# Patient Record
Sex: Female | Born: 1945 | Race: White | Hispanic: No | Marital: Married | Smoking: Never smoker
Health system: Southern US, Community
[De-identification: ages and names within clinical notes are randomized; demographics above are authoritative.]

## PROBLEM LIST (undated history)

## (undated) DIAGNOSIS — K222 Esophageal obstruction: Secondary | ICD-10-CM

## (undated) DIAGNOSIS — N200 Calculus of kidney: Secondary | ICD-10-CM

## (undated) DIAGNOSIS — K449 Diaphragmatic hernia without obstruction or gangrene: Secondary | ICD-10-CM

## (undated) DIAGNOSIS — D126 Benign neoplasm of colon, unspecified: Secondary | ICD-10-CM

## (undated) DIAGNOSIS — K759 Inflammatory liver disease, unspecified: Secondary | ICD-10-CM

## (undated) DIAGNOSIS — M109 Gout, unspecified: Secondary | ICD-10-CM

## (undated) DIAGNOSIS — B159 Hepatitis A without hepatic coma: Secondary | ICD-10-CM

## (undated) DIAGNOSIS — K648 Other hemorrhoids: Secondary | ICD-10-CM

## (undated) DIAGNOSIS — M1990 Unspecified osteoarthritis, unspecified site: Secondary | ICD-10-CM

## (undated) DIAGNOSIS — K2950 Unspecified chronic gastritis without bleeding: Secondary | ICD-10-CM

## (undated) DIAGNOSIS — K219 Gastro-esophageal reflux disease without esophagitis: Secondary | ICD-10-CM

## (undated) DIAGNOSIS — K5790 Diverticulosis of intestine, part unspecified, without perforation or abscess without bleeding: Secondary | ICD-10-CM

## (undated) DIAGNOSIS — E7800 Pure hypercholesterolemia, unspecified: Secondary | ICD-10-CM

## (undated) DIAGNOSIS — M65841 Other synovitis and tenosynovitis, right hand: Secondary | ICD-10-CM

## (undated) HISTORY — DX: Esophageal obstruction: K22.2

## (undated) HISTORY — DX: Benign neoplasm of colon, unspecified: D12.6

## (undated) HISTORY — DX: Calculus of kidney: N20.0

## (undated) HISTORY — DX: Diverticulosis of intestine, part unspecified, without perforation or abscess without bleeding: K57.90

## (undated) HISTORY — DX: Pure hypercholesterolemia, unspecified: E78.00

## (undated) HISTORY — DX: Diaphragmatic hernia without obstruction or gangrene: K44.9

## (undated) HISTORY — DX: Other hemorrhoids: K64.8

## (undated) HISTORY — PX: KNEE ARTHROSCOPY: SHX127

## (undated) HISTORY — PX: CARPAL TUNNEL RELEASE: SHX101

## (undated) HISTORY — DX: Unspecified chronic gastritis without bleeding: K29.50

## (undated) HISTORY — DX: Gout, unspecified: M10.9

## (undated) HISTORY — DX: Hepatitis a without hepatic coma: B15.9

## (undated) HISTORY — DX: Unspecified osteoarthritis, unspecified site: M19.90

## (undated) HISTORY — PX: POLYPECTOMY: SHX149

## (undated) HISTORY — DX: Gastro-esophageal reflux disease without esophagitis: K21.9

---

## 2006-08-02 HISTORY — PX: HEMORROIDECTOMY: SUR656

## 2007-08-03 HISTORY — PX: ABDOMINAL HYSTERECTOMY: SHX81

## 2009-12-16 ENCOUNTER — Encounter (HOSPITAL_COMMUNITY): Payer: Self-pay | Admitting: Obstetrics and Gynecology

## 2009-12-16 ENCOUNTER — Inpatient Hospital Stay (HOSPITAL_COMMUNITY): Admission: RE | Admit: 2009-12-16 | Discharge: 2009-12-18 | Payer: Self-pay | Admitting: Obstetrics and Gynecology

## 2010-10-19 LAB — CBC
HCT: 31 % — ABNORMAL LOW (ref 36.0–46.0)
Hemoglobin: 13.9 g/dL (ref 12.0–15.0)
MCHC: 33.5 g/dL (ref 30.0–36.0)
MCHC: 34.9 g/dL (ref 30.0–36.0)
MCV: 93.2 fL (ref 78.0–100.0)
MCV: 94.3 fL (ref 78.0–100.0)
Platelets: 177 10*3/uL (ref 150–400)
RDW: 14 % (ref 11.5–15.5)
RDW: 14.3 % (ref 11.5–15.5)

## 2010-10-19 LAB — COMPREHENSIVE METABOLIC PANEL
Alkaline Phosphatase: 66 U/L (ref 39–117)
Calcium: 9.5 mg/dL (ref 8.4–10.5)
Chloride: 105 mEq/L (ref 96–112)
Creatinine, Ser: 0.67 mg/dL (ref 0.4–1.2)
GFR calc Af Amer: >60 mL/min (ref >60)
Potassium: 3.7 mEq/L (ref 3.5–5.1)
Sodium: 139 mEq/L (ref 135–145)
Total Bilirubin: 0.8 mg/dL (ref 0.3–1.2)

## 2010-10-19 LAB — ABO/RH: ABO/RH(D): A NEG

## 2010-10-19 LAB — TYPE AND SCREEN: Antibody Screen: NEGATIVE

## 2010-11-06 IMAGING — CR DG ABD PORTABLE 1V
2 series · 2 of 2 positions shown · non-contrast
Comparison: None.

CLINICAL DATA: Evaluate for foreign body; BB sized metallic needle;
pelvic organ prolapse

ABDOMEN - 1 VIEW

[view not recorded (1 of 2)]
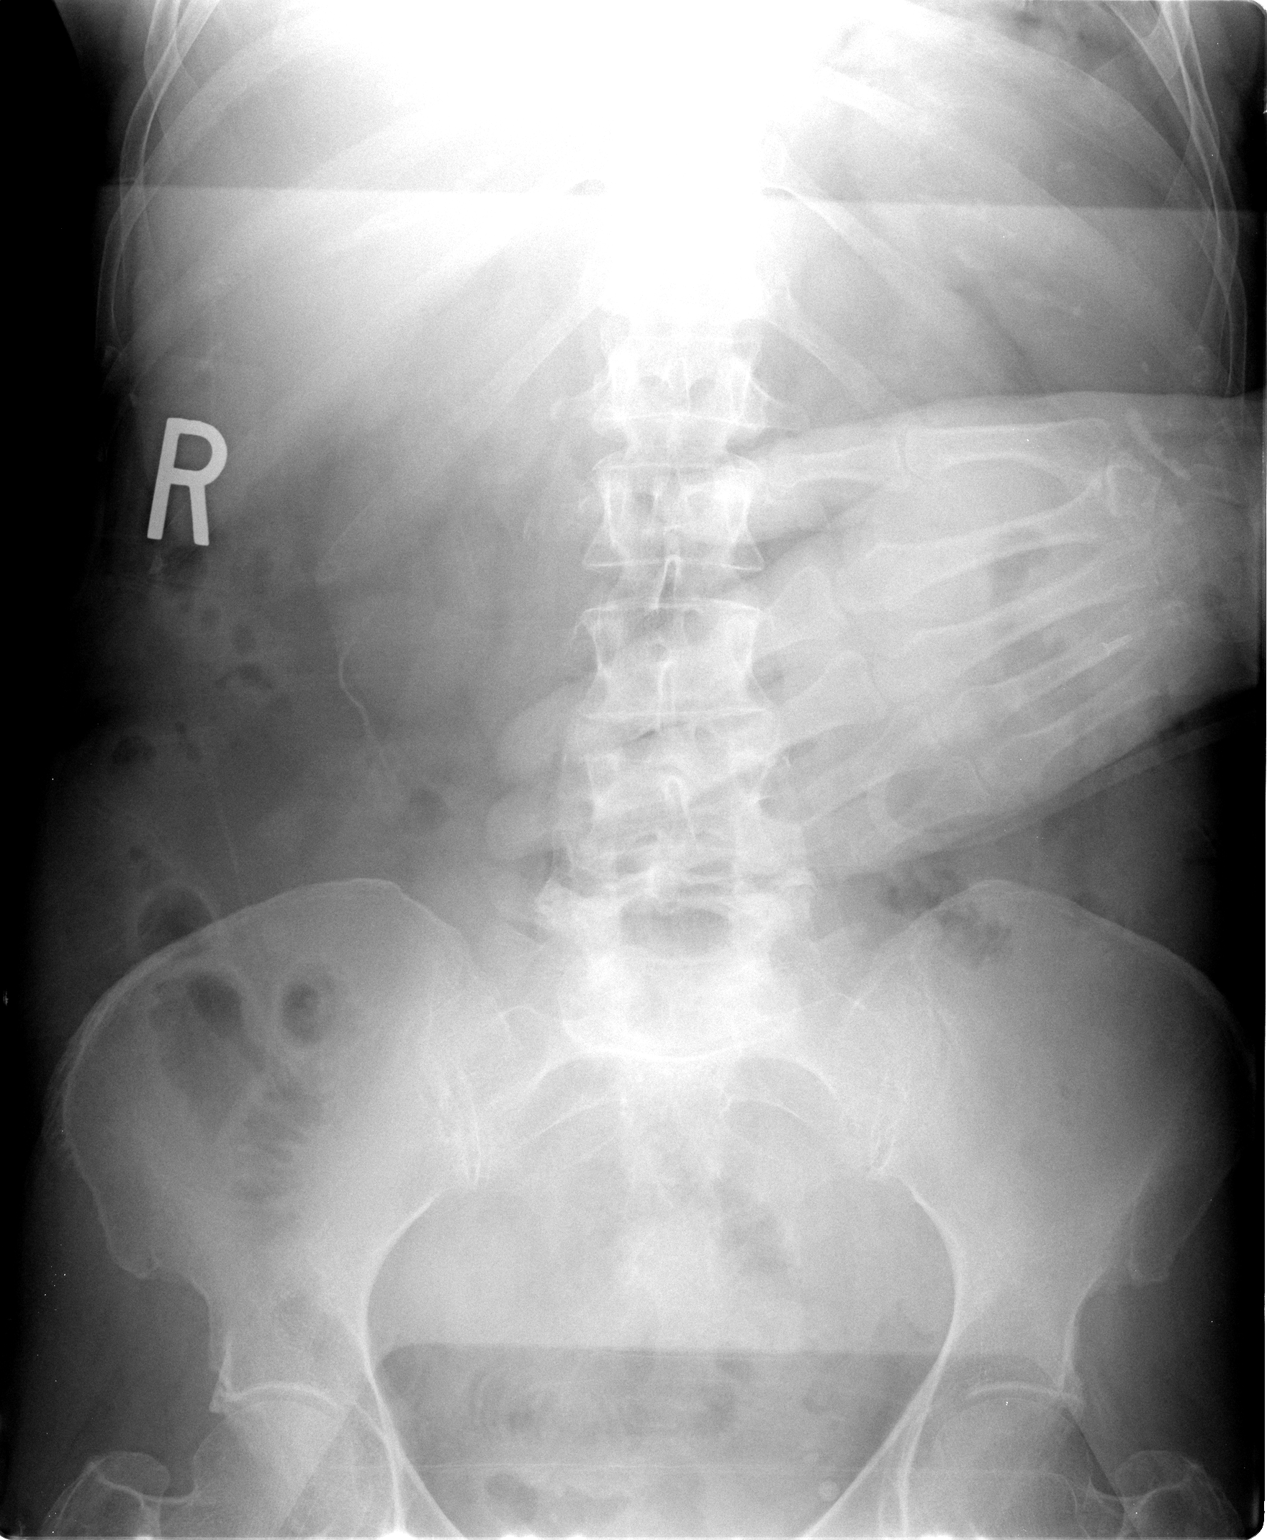

[view not recorded (2 of 2)]
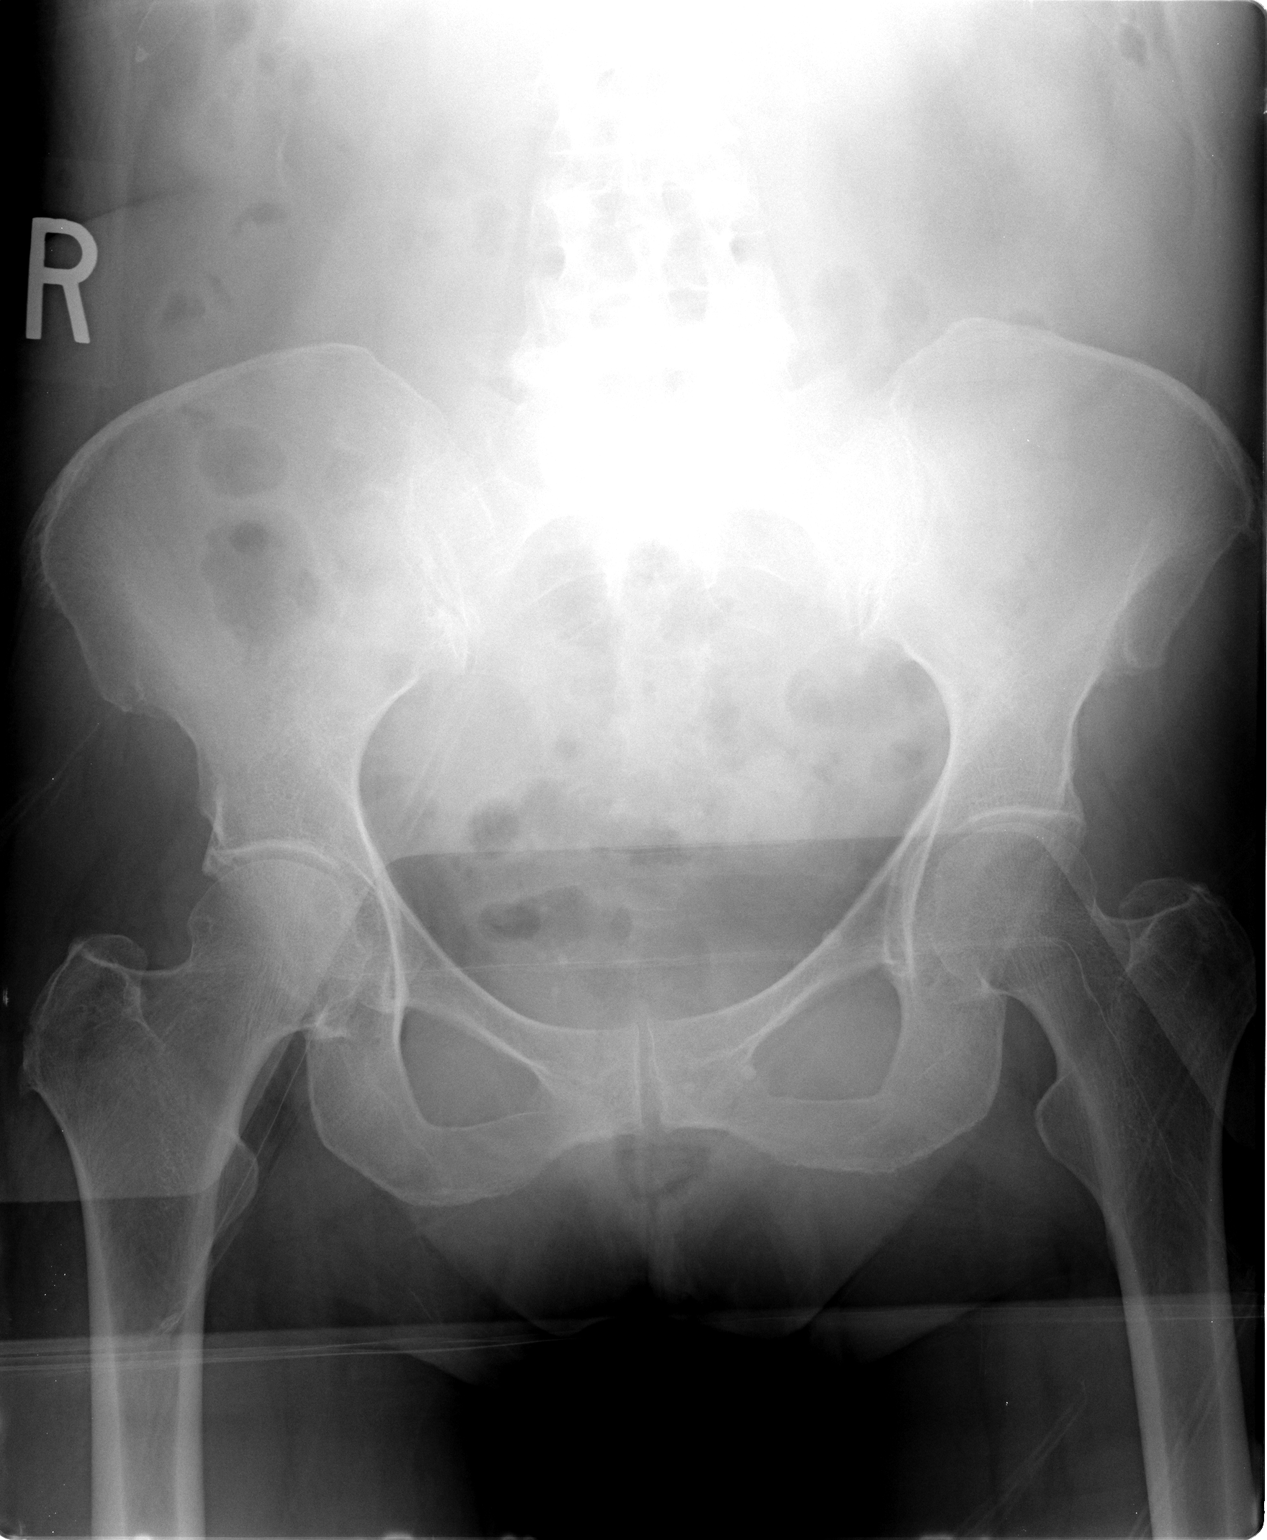

[2 of 2 positions shown; findings below may reference images not displayed]

FINDINGS: There are no radiopaque foreign bodies over the abdomen
or pelvis.

The stool and bowel gas pattern is normal.  Degenerative changes
are noted at L4-L5.
IMPRESSION: There are no unexpected radiopaque foreign bodies over the abdomen
or pelvis.

## 2015-03-14 ENCOUNTER — Other Ambulatory Visit: Payer: Self-pay | Admitting: Orthopedic Surgery

## 2015-03-17 ENCOUNTER — Encounter (HOSPITAL_BASED_OUTPATIENT_CLINIC_OR_DEPARTMENT_OTHER): Payer: Self-pay | Admitting: *Deleted

## 2015-03-18 ENCOUNTER — Ambulatory Visit (HOSPITAL_BASED_OUTPATIENT_CLINIC_OR_DEPARTMENT_OTHER)
Admission: RE | Admit: 2015-03-18 | Discharge: 2015-03-18 | Disposition: A | Discharge status: Home / Self Care | Payer: Medicare Other | Source: Ambulatory Visit | Attending: Orthopedic Surgery | Admitting: Orthopedic Surgery

## 2015-03-18 ENCOUNTER — Encounter (HOSPITAL_BASED_OUTPATIENT_CLINIC_OR_DEPARTMENT_OTHER)
Admission: RE | Disposition: A | Discharge status: Home / Self Care | Payer: Self-pay | Source: Ambulatory Visit | Attending: Orthopedic Surgery

## 2015-03-18 ENCOUNTER — Encounter (HOSPITAL_BASED_OUTPATIENT_CLINIC_OR_DEPARTMENT_OTHER): Payer: Self-pay | Admitting: Orthopedic Surgery

## 2015-03-18 ENCOUNTER — Ambulatory Visit (HOSPITAL_BASED_OUTPATIENT_CLINIC_OR_DEPARTMENT_OTHER): Payer: Medicare Other | Admitting: Certified Registered"

## 2015-03-18 DIAGNOSIS — M65341 Trigger finger, right ring finger: Secondary | ICD-10-CM | POA: Insufficient documentation

## 2015-03-18 DIAGNOSIS — B1920 Unspecified viral hepatitis C without hepatic coma: Secondary | ICD-10-CM | POA: Insufficient documentation

## 2015-03-18 HISTORY — DX: Other synovitis and tenosynovitis, right hand: M65.841

## 2015-03-18 HISTORY — PX: TRIGGER FINGER RELEASE: SHX641

## 2015-03-18 HISTORY — DX: Inflammatory liver disease, unspecified: K75.9

## 2015-03-18 LAB — POCT HEMOGLOBIN-HEMACUE: Hemoglobin: 14.7 g/dL (ref 12.0–15.0)

## 2015-03-18 SURGERY — RELEASE, A1 PULLEY, FOR TRIGGER FINGER
Anesthesia: Monitor Anesthesia Care | Site: Finger | Laterality: Right

## 2015-03-18 MED ORDER — ONDANSETRON HCL 4 MG/2ML IJ SOLN
INTRAMUSCULAR | Status: DC | PRN
  Administered 2015-03-18: 4 mg via INTRAVENOUS

## 2015-03-18 MED ORDER — MIDAZOLAM HCL 2 MG/2ML IJ SOLN
INTRAMUSCULAR | Status: AC
  Filled 2015-03-18: qty 2

## 2015-03-18 MED ORDER — PROMETHAZINE HCL 25 MG/ML IJ SOLN: 6.2500 mg | INTRAMUSCULAR | Status: DC | PRN

## 2015-03-18 MED ORDER — CHLORHEXIDINE GLUCONATE 4 % EX LIQD: 60.0000 mL | Freq: Once | CUTANEOUS | Status: DC

## 2015-03-18 MED ORDER — HYDROCODONE-ACETAMINOPHEN 5-325 MG PO TABS: 1.0000 | ORAL_TABLET | Freq: Four times a day (QID) | ORAL | Status: DC | PRN

## 2015-03-18 MED ORDER — HYDROMORPHONE HCL 1 MG/ML IJ SOLN: 0.2500 mg | INTRAMUSCULAR | Status: DC | PRN

## 2015-03-18 MED ORDER — LIDOCAINE HCL (PF) 0.5 % IJ SOLN
INTRAMUSCULAR | Status: DC | PRN
  Administered 2015-03-18: 25 mL via INTRAVENOUS

## 2015-03-18 MED ORDER — PROPOFOL 10 MG/ML IV BOLUS
INTRAVENOUS | Status: DC | PRN
  Administered 2015-03-18: 20 mg via INTRAVENOUS

## 2015-03-18 MED ORDER — CEFAZOLIN SODIUM-DEXTROSE 2-3 GM-% IV SOLR
INTRAVENOUS | Status: AC
  Filled 2015-03-18: qty 50

## 2015-03-18 MED ORDER — CEFAZOLIN SODIUM-DEXTROSE 2-3 GM-% IV SOLR
2.0000 g | INTRAVENOUS | Status: AC
  Administered 2015-03-18: 2 g via INTRAVENOUS

## 2015-03-18 MED ORDER — CEFAZOLIN SODIUM-DEXTROSE 2-3 GM-% IV SOLR: 2.0000 g | INTRAVENOUS | Status: DC

## 2015-03-18 MED ORDER — BUPIVACAINE HCL (PF) 0.25 % IJ SOLN
INTRAMUSCULAR | Status: DC | PRN
  Administered 2015-03-18: 4 mL

## 2015-03-18 MED ORDER — FENTANYL CITRATE (PF) 100 MCG/2ML IJ SOLN
50.0000 ug | INTRAMUSCULAR | Status: DC | PRN
  Administered 2015-03-18: 100 ug via INTRAVENOUS

## 2015-03-18 MED ORDER — SCOPOLAMINE 1 MG/3DAYS TD PT72: 1.0000 | MEDICATED_PATCH | Freq: Once | TRANSDERMAL | Status: DC | PRN

## 2015-03-18 MED ORDER — FENTANYL CITRATE (PF) 100 MCG/2ML IJ SOLN
INTRAMUSCULAR | Status: AC
  Filled 2015-03-18: qty 4

## 2015-03-18 MED ORDER — LACTATED RINGERS IV SOLN
INTRAVENOUS | Status: DC
  Administered 2015-03-18: 12:00:00 via INTRAVENOUS

## 2015-03-18 MED ORDER — PROPOFOL 500 MG/50ML IV EMUL
INTRAVENOUS | Status: AC
  Filled 2015-03-18: qty 50

## 2015-03-18 MED ORDER — GLYCOPYRROLATE 0.2 MG/ML IJ SOLN: 0.2000 mg | Freq: Once | INTRAMUSCULAR | Status: DC | PRN

## 2015-03-18 MED ORDER — MIDAZOLAM HCL 2 MG/2ML IJ SOLN
1.0000 mg | INTRAMUSCULAR | Status: DC | PRN
  Administered 2015-03-18: 2 mg via INTRAVENOUS

## 2015-03-18 SURGICAL SUPPLY — 32 items
BANDAGE COBAN STERILE 2 (GAUZE/BANDAGES/DRESSINGS) ×3 IMPLANT
BLADE SURG 15 STRL LF DISP TIS (BLADE) ×1 IMPLANT
BLADE SURG 15 STRL SS (BLADE) ×2
BNDG ESMARK 4X9 LF (GAUZE/BANDAGES/DRESSINGS) ×3 IMPLANT
CHLORAPREP W/TINT 26ML (MISCELLANEOUS) ×3 IMPLANT
CORDS BIPOLAR (ELECTRODE) IMPLANT
COVER BACK TABLE 60X90IN (DRAPES) ×3 IMPLANT
COVER MAYO STAND STRL (DRAPES) ×3 IMPLANT
CUFF TOURNIQUET SINGLE 18IN (TOURNIQUET CUFF) ×3 IMPLANT
DECANTER SPIKE VIAL GLASS SM (MISCELLANEOUS) IMPLANT
DRAPE EXTREMITY T 121X128X90 (DRAPE) ×3 IMPLANT
DRAPE SURG 17X23 STRL (DRAPES) ×3 IMPLANT
GAUZE SPONGE 4X4 12PLY STRL (GAUZE/BANDAGES/DRESSINGS) ×3 IMPLANT
GAUZE XEROFORM 1X8 LF (GAUZE/BANDAGES/DRESSINGS) ×3 IMPLANT
GLOVE BIOGEL PI IND STRL 7.0 (GLOVE) ×2 IMPLANT
GLOVE BIOGEL PI IND STRL 8.5 (GLOVE) ×1 IMPLANT
GLOVE BIOGEL PI INDICATOR 7.0 (GLOVE) ×4
GLOVE BIOGEL PI INDICATOR 8.5 (GLOVE) ×2
GLOVE ECLIPSE 6.5 STRL STRAW (GLOVE) ×3 IMPLANT
GLOVE SURG ORTHO 8.0 STRL STRW (GLOVE) ×3 IMPLANT
GOWN STRL REUS W/ TWL LRG LVL3 (GOWN DISPOSABLE) ×1 IMPLANT
GOWN STRL REUS W/TWL LRG LVL3 (GOWN DISPOSABLE) ×2
GOWN STRL REUS W/TWL XL LVL3 (GOWN DISPOSABLE) ×3 IMPLANT
NEEDLE PRECISIONGLIDE 27X1.5 (NEEDLE) ×3 IMPLANT
NS IRRIG 1000ML POUR BTL (IV SOLUTION) ×3 IMPLANT
PACK BASIN DAY SURGERY FS (CUSTOM PROCEDURE TRAY) ×3 IMPLANT
STOCKINETTE 4X48 STRL (DRAPES) ×3 IMPLANT
SUT ETHILON 4 0 PS 2 18 (SUTURE) ×3 IMPLANT
SYR BULB 3OZ (MISCELLANEOUS) ×3 IMPLANT
SYR CONTROL 10ML LL (SYRINGE) ×3 IMPLANT
TOWEL OR 17X24 6PK STRL BLUE (TOWEL DISPOSABLE) ×3 IMPLANT
UNDERPAD 30X30 (UNDERPADS AND DIAPERS) IMPLANT

## 2015-03-18 NOTE — Op Note (Signed)
[name], [name]                ACCOUNT NO.:  [id number]  MEDICAL RECORD NO.:  [id number]  LOCATION:                                 FACILITY:  PHYSICIAN:  Daryll Brod, M.D.            DATE OF BIRTH:  DATE OF PROCEDURE:  03/18/2015 DATE OF DISCHARGE:                              OPERATIVE REPORT   PREOPERATIVE DIAGNOSIS:  Stenosing tenosynovitis, right ring finger.  POSTOPERATIVE DIAGNOSIS:  Stenosing tenosynovitis, right ring finger.  OPERATION:  Release of A1 pulley, right ring finger.  SURGEON:  Daryll Brod, MD.  ANESTHESIA:  Forearm-based IV regional with local infiltration.  DATE OF OPERATION:  March 18, 2015.  ANESTHESIOLOGIST:  Finis Bud, M.D.  HISTORY:  The patient is a 69 year old female with a history of locking of her right ring finger.  This has not responded to conservative treatment.  She has elected to undergo surgical release of the A1 pulley.  Pre, peri, and postoperative course were discussed along with risks and complications.  She is aware that there is no guarantee with the surgery, possibility of infection; recurrence of injury to arteries, nerves, tendons; incomplete relief of symptoms, dystrophy.  In the preoperative area, the patient is seen, the extremity marked by both patient and surgeon.  Antibiotic given.  PROCEDURE IN DETAIL:  The patient was brought to the operating room, where a forearm-based IV regional anesthetic was carried out without difficulty.  She was prepped using ChloraPrep, supine position, right arm free.  A 3-minute dry time was allowed.  Time-out taken confirming the patient and procedure.  An oblique incision was made over the A1 pulley of the right ring finger, carried down through subcutaneous tissue.  Several cysts were encountered on the flexor sheath.  These were removed, and an incision was then made on the radial aspect of the A1 pulley which was found to be markedly thickened.  A partial tenosynovectomy was  performed proximally with separation of the superficialis profundus tendons.  A small incision was made centrally in A2.  The finger was placed through a full range of motion, no further triggering was identified.  The wound was copiously irrigated with saline.  The skin was closed with interrupted 4-0 nylon sutures.  Local infiltration with 0.25% bupivacaine without epinephrine was given, approximately 6 mL was used.  Sterile compressive dressing with the fingers free was applied.  On deflation of the tourniquet, all fingers immediately pinked.  She was taken to the recovery room for observation in satisfactory condition.  She will be discharged home to return to the [facility] in 1 week on Norco.          ______________________________ Daryll Brod, M.D.     GK/MEDQ  D:  03/18/2015  T:  03/18/2015  Job:  [id number]

## 2015-03-18 NOTE — Transfer of Care (Signed)
Immediate Anesthesia Transfer of Care Note  Patient: Donna Wells  Procedure(s) Performed: Procedure(s) with comments: RELEASE TRIGGER FINGER/A-1 PULLEY RIGHT FING FINGER (Right) - REG/FAB  Patient Location: PACU  Anesthesia Type:Bier block  Level of Consciousness: awake and patient cooperative  Airway & Oxygen Therapy: Patient Spontanous Breathing and Patient connected to face mask oxygen  Post-op Assessment: Report given to RN and Post -op Vital signs reviewed and stable  Post vital signs: Reviewed and stable  Last Vitals:  Filed Vitals:   03/18/15 1126  BP: 154/68  Pulse: 66  Temp: 36.8 C  Resp: 20    Complications: No apparent anesthesia complications

## 2015-03-18 NOTE — Discharge Instructions (Addendum)
[facility] Instructions °Hand Surgery ° °Wound Care: °Keep your hand elevated above the level of your heart.  Do not allow it to dangle by your side.  Keep the dressing dry and do not remove it unless your doctor advises you to do so.  He will usually change it at the time of your post-op visit.  Moving your fingers is advised to stimulate circulation but will depend on the site of your surgery.  If you have a splint applied, your doctor will advise you regarding movement. ° °Activity: °Do not drive or operate machinery today.  Rest today and then you may return to your normal activity and work as indicated by your physician. ° °Diet:  °Drink liquids today or eat a light diet.  You may resume a regular diet tomorrow.   ° °General expectations: °Pain for two to three days. °Fingers may become slightly swollen. ° °Call your doctor if any of the following occur: °Severe pain not relieved by pain medication. °Elevated temperature. °Dressing soaked with blood. °Inability to move fingers. °White or bluish color to fingers. ° ° °Post Anesthesia Home Care Instructions ° °Activity: °Get plenty of rest for the remainder of the day. A responsible adult should stay with you for 24 hours following the procedure.  °For the next 24 hours, DO NOT: °-Drive a car °-Operate machinery °-Drink alcoholic beverages °-Take any medication unless instructed by your physician °-Make any legal decisions or sign important papers. ° °Meals: °Start with liquid foods such as gelatin or soup. Progress to regular foods as tolerated. Avoid greasy, spicy, heavy foods. If nausea and/or vomiting occur, drink only clear liquids until the nausea and/or vomiting subsides. Call your physician if vomiting continues. ° °Special Instructions/Symptoms: °Your throat may feel dry or sore from the anesthesia or the breathing tube placed in your throat during surgery. If this causes discomfort, gargle with warm salt water. The discomfort should disappear within 24  hours. ° °If you had a scopolamine patch placed behind your ear for the management of post- operative nausea and/or vomiting: ° °1. The medication in the patch is effective for 72 hours, after which it should be removed.  Wrap patch in a tissue and discard in the trash. Wash hands thoroughly with soap and water. °2. You may remove the patch earlier than 72 hours if you experience unpleasant side effects which may include dry mouth, dizziness or visual disturbances. °3. Avoid touching the patch. Wash your hands with soap and water after contact with the patch. °  °Call your surgeon if you experience:  ° °1.  Fever over 101.0. °2.  Inability to urinate. °3.  Nausea and/or vomiting. °4.  Extreme swelling or bruising at the surgical site. °5.  Continued bleeding from the incision. °6.  Increased pain, redness or drainage from the incision. °7.  Problems related to your pain medication. °8. Any change in color, movement and/or sensation °9. Any problems and/or concerns ° ° °

## 2015-03-18 NOTE — H&P (Signed)
  Donna Wells is a 69 year old right hand dominant female who comes in complaining of catching of her right ring finger. This has been going on for a year. She has seen Dr. Burney Gauze in the past and doesn't want to see him again with respect to it. She has had carpal tunnel release done by Dr. Earleen Newport years ago. She is complaining of the triggering. She has had 1 injection which gave her a response which she did not like. She states it gave her temporary relief and is back now. She is complaining of some numbness and tingling on the dorsal aspect of her hand up to her elbow. She complains of a moderate aching pain with a feeling of catching. She has been wearing a splint without relief.   PAST MEDICAL HISTORY:  She is allergic to sulfa. She is not currently taking any medications. She has had carpal tunnel release, hemorrhoidectomy, hysterectomy, eye lid surgery, and tonsillectomy.  FAMILY MEDICAL HISTORY: Positive for high BP.  SOCIAL HISTORY:  She does not smoke or use alcohol. She is married and retired.  REVIEW OF SYSTEMS: Positive for Hepatitis, rash, glasses, hearing loss, otherwise negative 14 points.  Donna Wells is an 69 y.o. female.   Chief Complaint: catching right ring finger HPI: see above  Past Medical History  Diagnosis Date  . Stenosing tenosynovitis of finger of right hand     RRF  . Hepatitis     Hep C at 69 yrs old    Past Surgical History  Procedure Laterality Date  . Abdominal hysterectomy  2009  . Hemorroidectomy  2008  . Knee arthroscopy Left   . Carpal tunnel release Bilateral     History reviewed. No pertinent family history. Social History:  reports that she has never smoked. She does not have any smokeless tobacco history on file. She reports that she does not drink alcohol or use illicit drugs.  Allergies:  Allergies  Allergen Reactions  . Sulfa Antibiotics Rash    No prescriptions prior to admission    No results found for this or any previous  visit (from the past 48 hour(s)).  No results found.   Pertinent items are noted in HPI.  Height 5\' 5"  (1.651 m), weight 75.751 kg (167 lb).  General appearance: alert, cooperative and appears stated age Head: Normocephalic, without obvious abnormality Neck: no JVD Resp: clear to auscultation bilaterally Cardio: regular rate and rhythm, S1, S2 normal, no murmur, click, rub or gallop GI: soft, non-tender; bowel sounds normal; no masses,  no organomegaly Extremities:catching right ring finger Pulses: 2+ and symmetric Skin: Skin color, texture, turgor normal. No rashes or lesions Neurologic: Grossly normal Incision/Wound: na  Assessment/Plan X-rays reveal degenerative changes at the [facility] joint of her thumb with stressing of the [facility] joint it produces crepitation on both sides and minimal pain on her right side.  DIAGNOSIS: (1) CMC arthritis asymptomatic. (2) STS right ring finger.  We have discussed each of these with her. She would like to proceed to have the right ring finger A-1 pulley release. Pre, peri and post op care are discussed along with risks and complications. Patient is aware there is no guarantee with surgery, possibility of infection, injury to arteries, nerves, and tendons, incomplete relief and dystrophy. This is scheduled as an outpatient under regional anesthesia.  Hina Gupta R 03/18/2015, 9:17 AM

## 2015-03-18 NOTE — Op Note (Signed)
Dictation Number [id number]

## 2015-03-18 NOTE — Anesthesia Preprocedure Evaluation (Addendum)
Anesthesia Evaluation  Patient identified by MRN, date of birth, ID band Patient awake    Reviewed: Allergy & Precautions, H&P , Patient's Chart, lab work & pertinent test results  Airway Mallampati: II  TM Distance: >3 FB Neck ROM: Full    Dental   Pulmonary neg pulmonary ROS,  breath sounds clear to auscultation        Cardiovascular negative cardio ROS  Rhythm:Regular Rate:Normal     Neuro/Psych    GI/Hepatic negative GI ROS, (+) Hepatitis -  Endo/Other  negative endocrine ROS  Renal/GU      Musculoskeletal   Abdominal   Peds  Hematology negative hematology ROS (+)   Anesthesia Other Findings   Reproductive/Obstetrics                            Anesthesia Physical Anesthesia Plan  ASA: II  Anesthesia Plan: Bier Block and MAC   Post-op Pain Management:    Induction:   Airway Management Planned:   Additional Equipment:   Intra-op Plan:   Post-operative Plan:   Informed Consent: I have reviewed the patients History and Physical, chart, labs and discussed the procedure including the risks, benefits and alternatives for the proposed anesthesia with the patient or authorized representative who has indicated his/her understanding and acceptance.   Dental advisory given  Plan Discussed with: CRNA and Anesthesiologist  Anesthesia Plan Comments:        Anesthesia Quick Evaluation

## 2015-03-18 NOTE — Brief Op Note (Signed)
03/18/2015  1:17 PM  PATIENT:  Donna Wells  69 y.o. female  PRE-OPERATIVE DIAGNOSIS:  STENOSING TENOSYNOVITIS RIGHT RING FINGER  POST-OPERATIVE DIAGNOSIS:  STENOSING TENOSYNOVITIS RIGHT RING FINGER  PROCEDURE:  Procedure(s) with comments: RELEASE TRIGGER FINGER/A-1 PULLEY RIGHT FING FINGER (Right) - REG/FAB  SURGEON:  Surgeon(s) and Role:    * Daryll Brod, MD - Primary  PHYSICIAN ASSISTANT:   ASSISTANTS: none   ANESTHESIA:   local and regional  EBL:     BLOOD ADMINISTERED:none  DRAINS: none   LOCAL MEDICATIONS USED:  BUPIVICAINE   SPECIMEN:  No Specimen  DISPOSITION OF SPECIMEN:  N/A  COUNTS:  YES  TOURNIQUET:   Total Tourniquet Time Documented: Forearm (Right) - 14 minutes Total: Forearm (Right) - 14 minutes   DICTATION: .Other Dictation: Dictation Number [id number]  PLAN OF CARE: Discharge to home after PACU  PATIENT DISPOSITION:  PACU - hemodynamically stable.

## 2015-03-18 NOTE — Anesthesia Postprocedure Evaluation (Signed)
  Anesthesia Post-op Note  Patient: Donna Wells  Procedure(s) Performed: Procedure(s) with comments: RELEASE TRIGGER FINGER/A-1 PULLEY RIGHT FING FINGER (Right) - REG/FAB  Patient Location: PACU  Anesthesia Type:MAC  Level of Consciousness: awake  Airway and Oxygen Therapy: Patient Spontanous Breathing  Post-op Pain: mild  Post-op Assessment: Post-op Vital signs reviewed              Post-op Vital Signs: Reviewed  Last Vitals:  Filed Vitals:   03/18/15 1413  BP: 134/69  Pulse: 62  Temp: 36.8 C  Resp: 16    Complications: No apparent anesthesia complications

## 2015-03-19 ENCOUNTER — Encounter (HOSPITAL_BASED_OUTPATIENT_CLINIC_OR_DEPARTMENT_OTHER): Payer: Self-pay | Admitting: Orthopedic Surgery

## 2015-04-08 ENCOUNTER — Other Ambulatory Visit: Payer: Self-pay | Admitting: Obstetrics and Gynecology

## 2015-04-10 LAB — CYTOLOGY - PAP

## 2017-03-15 ENCOUNTER — Encounter: Payer: Self-pay | Admitting: Internal Medicine

## 2017-04-01 ENCOUNTER — Ambulatory Visit: Payer: Medicare Other | Admitting: Internal Medicine

## 2017-04-29 ENCOUNTER — Encounter: Payer: Self-pay | Admitting: *Deleted

## 2017-05-16 ENCOUNTER — Ambulatory Visit: Payer: Medicare Other | Admitting: Internal Medicine

## 2017-05-16 ENCOUNTER — Encounter: Payer: Self-pay | Admitting: Internal Medicine

## 2017-05-16 ENCOUNTER — Ambulatory Visit (INDEPENDENT_AMBULATORY_CARE_PROVIDER_SITE_OTHER): Payer: Medicare Other | Admitting: Internal Medicine

## 2017-05-16 ENCOUNTER — Encounter (INDEPENDENT_AMBULATORY_CARE_PROVIDER_SITE_OTHER): Payer: Self-pay

## 2017-05-16 ENCOUNTER — Other Ambulatory Visit (INDEPENDENT_AMBULATORY_CARE_PROVIDER_SITE_OTHER): Payer: Medicare Other

## 2017-05-16 VITALS — BP 142/80 | HR 72 | Ht 65.0 in | Wt 170.8 lb

## 2017-05-16 DIAGNOSIS — R1011 Right upper quadrant pain: Secondary | ICD-10-CM

## 2017-05-16 DIAGNOSIS — K760 Fatty (change of) liver, not elsewhere classified: Secondary | ICD-10-CM

## 2017-05-16 DIAGNOSIS — R1030 Lower abdominal pain, unspecified: Secondary | ICD-10-CM

## 2017-05-16 DIAGNOSIS — K921 Melena: Secondary | ICD-10-CM | POA: Diagnosis not present

## 2017-05-16 DIAGNOSIS — K625 Hemorrhage of anus and rectum: Secondary | ICD-10-CM

## 2017-05-16 LAB — CBC WITH DIFFERENTIAL/PLATELET
Basophils Absolute: 0 10*3/uL (ref 0.0–0.1)
Basophils Relative: 0.7 % (ref 0.0–3.0)
EOS PCT: 2.5 % (ref 0.0–5.0)
Eosinophils Absolute: 0.1 10*3/uL (ref 0.0–0.7)
HCT: 40.4 % (ref 36.0–46.0)
Hemoglobin: 13.5 g/dL (ref 12.0–15.0)
LYMPHS ABS: 1.6 10*3/uL (ref 0.7–4.0)
Lymphocytes Relative: 34.5 % (ref 12.0–46.0)
MCHC: 33.3 g/dL (ref 30.0–36.0)
MCV: 94.6 fl (ref 78.0–100.0)
MONO ABS: 0.3 10*3/uL (ref 0.1–1.0)
MONOS PCT: 6.5 % (ref 3.0–12.0)
NEUTROS ABS: 2.5 10*3/uL (ref 1.4–7.7)
NEUTROS PCT: 55.8 % (ref 43.0–77.0)
Platelets: 221 10*3/uL (ref 150.0–400.0)
RBC: 4.27 Mil/uL (ref 3.87–5.11)
RDW: 13.6 % (ref 11.5–15.5)
WBC: 4.5 10*3/uL (ref 4.0–10.5)

## 2017-05-16 LAB — IBC PANEL
IRON: 85 ug/dL (ref 42–145)
Saturation Ratios: 21.8 % (ref 20.0–50.0)
Transferrin: 278 mg/dL (ref 212.0–360.0)

## 2017-05-16 LAB — COMPREHENSIVE METABOLIC PANEL
ALK PHOS: 63 U/L (ref 39–117)
ALT: 39 U/L — ABNORMAL HIGH (ref 0–35)
AST: 30 U/L (ref 0–37)
Albumin: 4.2 g/dL (ref 3.5–5.2)
BUN: 13 mg/dL (ref 6–23)
CHLORIDE: 104 meq/L (ref 96–112)
CO2: 26 mEq/L (ref 19–32)
Calcium: 9.7 mg/dL (ref 8.4–10.5)
Creatinine, Ser: 0.74 mg/dL (ref 0.40–1.20)
GFR: 82.15 mL/min (ref >60.00)
GLUCOSE: 90 mg/dL (ref 70–99)
POTASSIUM: 3.9 meq/L (ref 3.5–5.1)
SODIUM: 141 meq/L (ref 135–145)
TOTAL PROTEIN: 7.3 g/dL (ref 6.0–8.3)
Total Bilirubin: 0.7 mg/dL (ref 0.2–1.2)

## 2017-05-16 LAB — TSH: TSH: 5.53 u[IU]/mL — AB (ref 0.35–4.50)

## 2017-05-16 LAB — IGA: IGA: 124 mg/dL (ref 68–378)

## 2017-05-16 LAB — FERRITIN: FERRITIN: 80.6 ng/mL (ref 10.0–291.0)

## 2017-05-16 MED ORDER — VSL#3 PO CAPS: 1.0000 | ORAL_CAPSULE | Freq: Every day | ORAL | 0 refills | Status: DC

## 2017-05-16 MED ORDER — SUPREP BOWEL PREP KIT 17.5-3.13-1.6 GM/177ML PO SOLN
1.0000 | ORAL | 0 refills | Status: DC
Start: 2017-05-16 — End: 2017-06-02

## 2017-05-16 NOTE — Progress Notes (Signed)
Patient ID: Donna Wells, female   DOB: May 02, 1946, 71 y.o.   MRN: [id number] HPI: [name] is a 71 year old female with a past medical history of history of hyperlipidemia, arthritis, internal hemorrhoids status post hemorrhoidal banding in 2006 followed by hemorrhoidectomy in 2008 who is seen in consultation at the request of Dr. Bobby Rumpf to evaluate upper and lower abdominal pain and rectal bleeding. She is here alone today.  She reports that she has had on and off issues with constipation alternating with loose stools. She reports having some bowel movements which are hard and difficult to pass and others that are loose and urgent. She denies diarrhea. She does report feeling incomplete evacuation. Over the last several weeks she has noted intermittent red blood with stools. She had seen this in the distant past prior to hemorrhoidectomy but none recently. She endorses several days recently of lower abdominal crampy pain which seems to improve after bowel movement. She reports this at times has felt "stabbing". It is not present now. She also reports several days ago she had one very black stool. She denies Pepto-Bismol. No further black stools.  She reports right upper quadrant pain lead to this appointment. This started about 6 or 8 weeks ago and at times was severe. This has improved and not recurred recently. She had an ultrasound performed of her right upper abdomen ordered by primary care. She brought this report and images on a disc, which I reviewed. This showed probable mild hepatic steatosis but was otherwise unremarkable.  She denies heartburn, indigestion. She reports that she has "dreaded mealtime" because food seems to exacerbate her alternating bowel habits. She reports she can have nausea, particularly if she gets severely constipated. She feels that she will vomit but denies vomiting.  She had a colonoscopy in 2006 which is reviewed. This was normal except for grade 2 internal  hemorrhoids. There were no polyps, diverticula or other lesions identified. Ten-year recall was recommended. Of note her medical records suggest that several years ago she was treated empirically for diverticulitis but I do not see CT scan performed.  She does not take any medication other than vitamin D. She denies a family history of colon cancer or colon polyps. Her daughter had ovarian cancer which was treated with surgery.  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 She feels that she contracted this illness from one of her coworkers. Became jaundiced and hospitalized for a week. After this no further history of jaundice, itching, ascites or lower extremity edema. History of liver disease. Her sister contracted the same illness and was also hospitalized with jaundice.  Past Medical History:  Diagnosis Date  . Arthritis   . Elevated cholesterol   . Hepatitis    Hep C at 71 yrs old  . Internal hemorrhoids   . Kidney stones   . Stenosing tenosynovitis of finger of right hand    RRF    Past Surgical History:  Procedure Laterality Date  . ABDOMINAL HYSTERECTOMY  2009  . CARPAL TUNNEL RELEASE Bilateral   . HEMORROIDECTOMY  2008  . KNEE ARTHROSCOPY Left   . TRIGGER FINGER RELEASE Right 03/18/2015   Procedure: RELEASE TRIGGER FINGER/A-1 PULLEY RIGHT FING FINGER;  Surgeon: Daryll Brod, MD;  Location: [place];  Service: Orthopedics;  Laterality: Right;  REG/FAB    Outpatient Medications Prior to Visit  Medication Sig Dispense Refill  . Vitamin D, Ergocalciferol, (DRISDOL) 50000 UNITS CAPS capsule Take 50,000  Units by mouth 2 (two) times a week.     . calcium carbonate (OS-CAL - DOSED IN MG OF ELEMENTAL CALCIUM) 1250 (500 CA) MG tablet Take 1 tablet by mouth.    Marland Kitchen HYDROcodone-acetaminophen (NORCO) 5-325 MG per tablet Take 1 tablet by mouth every 6 (six) hours as needed for moderate pain. 30 tablet 0  . Multiple Vitamin (MULTIVITAMIN  WITH MINERALS) TABS tablet Take 1 tablet by mouth daily.     No facility-administered medications prior to visit.     Allergies  Allergen Reactions  . Sulfa Antibiotics Rash    Family History  Problem Relation Age of Onset  . Ovarian cancer Daughter     Social History  Substance Use Topics  . Smoking status: Never Smoker  . Smokeless tobacco: Never Used  . Alcohol use No    ROS: As per history of present illness, otherwise negative  BP (!) 142/80   Pulse 72   Ht 5\' 5"  (1.651 m)   Wt 170 lb 12.8 oz (77.5 kg)   BMI 28.42 kg/m  Constitutional: Well-developed and well-nourished. No distress. HEENT: Normocephalic and atraumatic. Oropharynx is clear and moist. Conjunctivae are normal.  No scleral icterus. Neck: Neck supple. Trachea midline. Cardiovascular: Normal rate, regular rhythm and intact distal pulses. No M/R/G Pulmonary/chest: Effort normal and breath sounds normal. No wheezing, rales or rhonchi. Abdominal: Soft, Right upper quadrant and lower abdominal tenderness without rebound or guarding, nondistended. Bowel sounds active throughout. There are no masses palpable. No hepatosplenomegaly. Extremities: no clubbing, cyanosis, or edema Neurological: Alert and oriented to person place and time. Skin: Skin is warm and dry.  Psychiatric: Normal mood and affect. Behavior is normal.  RELEVANT LABS AND IMAGING: Ultrasound reviewed as per history of present illness  Labs reviewed by care everywhere. Looking back several years that do not see a prior elevation in AST, ALT, alkaline phosphatase or bilirubin with the exception of one elevated bilirubin which was very minimally elevated several years ago  ASSESSMENT/PLAN: 71 year old female with a past medical history of history of hyperlipidemia, arthritis, internal hemorrhoids status post hemorrhoidal banding in 2006 followed by hemorrhoidectomy in 2008 who is seen in consultation at the request of Dr. Bobby Rumpf to evaluate upper  and lower abdominal pain and rectal bleeding.   1. Right upper quadrant abdominal pain/bilateral lower abdominal pain/rectal bleeding/black stool/alternating bowel habits -- I have recommended lab work, upper endoscopy and colonoscopy today and trial of probiotic. She has multiple abdominal symptoms which are difficult to localize to one process. Ultrasound does not show gallstones or overt gallbladder pathology. Upper endoscopy to rule out gastritis and peptic ulcer disease. Alternating bowel habits and lower abdominal cramping along with rectal bleeding wart colonoscopy. Rule out polyps, colitis. Trial of VSL 3, 1 capsule daily to try to regulate her bowel movements. Further treatment decisions will be based on findings at endoscopy. We discussed the risks, benefits and alternatives to upper and lower endoscopy and she is agreeable and wishes to proceed  Check CBC, CMP, iron studies, TSH and celiac panel. Will send viral hepatitis serologies given her history of remote hepatitis with jaundice when she was age 10. This could have been acute hepatitis A.  2. Fatty liver -- mild steatosis seen by ultrasound. No evidence of chronic liver disease or portal hypertension. Checking liver enzymes as above. Liver enzymes have previously been normal which is reassuring. We discussed treatment of fatty liver disease which is primarily through diet and exercise. At this point I do  not feel that fatty liver disease explains her right upper quadrant abdominal pain. Will proceed as discussed in #1. She would likely benefit from vitamin E 800 international units daily.   NU:UVOZD, Jenne Pane, [place] [address]

## 2017-05-16 NOTE — Patient Instructions (Signed)
You have been scheduled for an endoscopy and colonoscopy. Please follow the written instructions given to you at your visit today. Please pick up your prep supplies at the pharmacy within the next 1-3 days. If you use inhalers (even only as needed), please bring them with you on the day of your procedure. Your physician has requested that you go to www.startemmi.com and enter the access code given to you at your visit today. This web site gives a general overview about your procedure. However, you should still follow specific instructions given to you by our office regarding your preparation for the procedure.  Your physician has requested that you go to the basement for the following lab work before leaving today: CBC, CMP, IBC, ferritin, TSH, Hep A total, Hep B surface antibody, Hep B surface antigen, Hep B total, Hep C core total, IgA, TtG  Please purchase the following medications over the counter and take as directed: VSL #3-1 capsule by mouth once daily  If you are age 28 or older, your body mass index should be between 23-30. Your Body mass index is 28.42 kg/m. If this is out of the aforementioned range listed, please consider follow up with your Primary Care Provider.  If you are age 53 or younger, your body mass index should be between 19-25. Your Body mass index is 28.42 kg/m. If this is out of the aformentioned range listed, please consider follow up with your Primary Care Provider.

## 2017-05-17 LAB — HEPATITIS A ANTIBODY, TOTAL: HEPATITIS A AB,TOTAL: REACTIVE — AB

## 2017-05-17 LAB — HEPATITIS B SURFACE ANTIGEN: HEP B S AG: NONREACTIVE

## 2017-05-17 LAB — HEPATITIS B SURFACE ANTIBODY,QUALITATIVE: Hep B S Ab: NONREACTIVE

## 2017-05-17 LAB — HEPATITIS B CORE ANTIBODY, TOTAL: Hep B Core Total Ab: NONREACTIVE

## 2017-05-17 LAB — HEPATITIS C ANTIBODY
HEP C AB: NONREACTIVE
SIGNAL TO CUT-OFF: 0.01 (ref <1.00)

## 2017-05-17 LAB — TISSUE TRANSGLUTAMINASE, IGA: (TTG) AB, IGA: 1 U/mL

## 2017-06-02 ENCOUNTER — Ambulatory Visit (AMBULATORY_SURGERY_CENTER): Payer: Medicare Other | Admitting: Internal Medicine

## 2017-06-02 ENCOUNTER — Encounter: Payer: Self-pay | Admitting: Internal Medicine

## 2017-06-02 VITALS — BP 137/60 | HR 76 | Temp 97.1°F | Resp 13 | Ht 65.0 in | Wt 170.0 lb

## 2017-06-02 DIAGNOSIS — R194 Change in bowel habit: Secondary | ICD-10-CM | POA: Diagnosis not present

## 2017-06-02 DIAGNOSIS — D122 Benign neoplasm of ascending colon: Secondary | ICD-10-CM | POA: Diagnosis not present

## 2017-06-02 DIAGNOSIS — D125 Benign neoplasm of sigmoid colon: Secondary | ICD-10-CM

## 2017-06-02 DIAGNOSIS — K625 Hemorrhage of anus and rectum: Secondary | ICD-10-CM | POA: Diagnosis not present

## 2017-06-02 DIAGNOSIS — K921 Melena: Secondary | ICD-10-CM | POA: Diagnosis not present

## 2017-06-02 DIAGNOSIS — R1011 Right upper quadrant pain: Secondary | ICD-10-CM

## 2017-06-02 DIAGNOSIS — R1030 Lower abdominal pain, unspecified: Secondary | ICD-10-CM

## 2017-06-02 DIAGNOSIS — K2950 Unspecified chronic gastritis without bleeding: Secondary | ICD-10-CM | POA: Diagnosis not present

## 2017-06-02 DIAGNOSIS — D120 Benign neoplasm of cecum: Secondary | ICD-10-CM | POA: Diagnosis not present

## 2017-06-02 HISTORY — PX: UPPER GASTROINTESTINAL ENDOSCOPY: SHX188

## 2017-06-02 HISTORY — PX: COLONOSCOPY: SHX174

## 2017-06-02 MED ORDER — PANTOPRAZOLE SODIUM 40 MG PO TBEC: 40.0000 mg | DELAYED_RELEASE_TABLET | Freq: Every day | ORAL | 5 refills | Status: DC

## 2017-06-02 MED ORDER — SODIUM CHLORIDE 0.9 % IV SOLN: 500.0000 mL | INTRAVENOUS | Status: DC

## 2017-06-02 NOTE — Progress Notes (Signed)
Pt's states no medical or surgical changes since previsit or office visit. 

## 2017-06-02 NOTE — Progress Notes (Signed)
No problems noted in the recovery room. Maw  Per Dr. Hilarie Fredrickson pt will trial Pantoprazole 40 mg daily for 1 month and let him know If helping her RUQ pain.  Also Per Dr. Hilarie Fredrickson pt needs appointment in office for follow up in Dec or Jan.  Maw  Appoint made for Jan 7, 11:00. maw

## 2017-06-02 NOTE — Op Note (Signed)
[facility] Patient Name: Donna Wells Procedure Date: 06/02/2017 3:31 PM MRN: [id number] Endoscopist: Jerene Bears , MD Age: 71 Referring MD:  Date of Birth: 06-07-1946 Gender: Female Account #: [id number] Procedure:                Upper GI endoscopy Indications:              Abdominal pain in the right upper quadrant, Lower                            abdominal pain, black stool Medicines:                Monitored Anesthesia Care Procedure:                Pre-Anesthesia Assessment:                           - Prior to the procedure, a History and Physical                            was performed, and patient medications and                            allergies were reviewed. The patient's tolerance of                            previous anesthesia was also reviewed. The risks                            and benefits of the procedure and the sedation                            options and risks were discussed with the patient.                            All questions were answered, and informed consent                            was obtained. Prior Anticoagulants: The patient has                            taken no previous anticoagulant or antiplatelet                            agents. ASA Grade Assessment: II - A patient with                            mild systemic disease. After reviewing the risks                            and benefits, the patient was deemed in                            satisfactory condition to undergo the procedure.  After obtaining informed consent, the endoscope was                            passed under direct vision. Throughout the                            procedure, the patient's blood pressure, pulse, and                            oxygen saturations were monitored continuously. The                            Endoscope was introduced through the mouth, and                            advanced to the second part of  duodenum. The upper                            GI endoscopy was accomplished without difficulty.                            The patient tolerated the procedure well. Scope In: Scope Out: Findings:                 A low-grade of narrowing Schatzki ring (acquired)                            was found at the gastroesophageal junction, 37 cm                            from the incisors. This was biopsied with a cold                            forceps for histology. The Z-line was slightly                            irregular. After passage of the endoscope there was                            a superficial mucosa rent at the level of the ring                            with spontaneous cessation of oozing.                           A 3 cm hiatal hernia was present.                           The entire examined stomach was normal. Biopsies                            were taken with a cold forceps for histology and  Helicobacter pylori testing.                           The examined duodenum was normal. Complications:            No immediate complications. Estimated Blood Loss:     Estimated blood loss was minimal. Impression:               - Low-grade of narrowing Schatzki ring with                            slightly irregular Z-line. Biopsied.                           - 3 cm hiatal hernia.                           - Normal stomach. Biopsied.                           - Normal examined duodenum. Recommendation:           - Patient has a contact number available for                            emergencies. The signs and symptoms of potential                            delayed complications were discussed with the                            patient. Return to normal activities tomorrow.                            Written discharge instructions were provided to the                            patient.                           - Resume previous diet.                            - Continue present medications.                           - Trial of pantoprazole 40 mg once daily for RUQ                            pain.                           - Await pathology results.                           - See the other procedure note for documentation of  additional recommendations. Jerene Bears, MD 06/02/2017 4:19:51 PM This report has been signed electronically.

## 2017-06-02 NOTE — Progress Notes (Signed)
Called to room to assist during endoscopic procedure.  Patient ID and intended procedure confirmed with present staff. Received instructions for my participation in the procedure from the performing physician.  

## 2017-06-02 NOTE — Progress Notes (Signed)
To recovery, report to RN, VSS. 

## 2017-06-02 NOTE — Patient Instructions (Signed)
YOU HAD AN ENDOSCOPIC PROCEDURE TODAY AT [facility]:   Refer to the procedure report that was given to you for any specific questions about what was found during the examination.  If the procedure report does not answer your questions, please call your gastroenterologist to clarify.  If you requested that your care partner not be given the details of your procedure findings, then the procedure report has been included in a sealed envelope for you to review at your convenience later.  YOU SHOULD EXPECT: Some feelings of bloating in the abdomen. Passage of more gas than usual.  Walking can help get rid of the air that was put into your GI tract during the procedure and reduce the bloating. If you had a lower endoscopy (such as a colonoscopy or flexible sigmoidoscopy) you may notice spotting of blood in your stool or on the toilet paper. If you underwent a bowel prep for your procedure, you may not have a normal bowel movement for a few days.  Please Note:  You might notice some irritation and congestion in your nose or some drainage.  This is from the oxygen used during your procedure.  There is no need for concern and it should clear up in a day or so.  SYMPTOMS TO REPORT IMMEDIATELY:   Following lower endoscopy (colonoscopy or flexible sigmoidoscopy):  Excessive amounts of blood in the stool  Significant tenderness or worsening of abdominal pains  Swelling of the abdomen that is new, acute  Fever of 100F or higher   Following upper endoscopy (EGD)  Vomiting of blood or coffee ground material  New chest pain or pain under the shoulder blades  Painful or persistently difficult swallowing  New shortness of breath  Fever of 100F or higher  Black, tarry-looking stools  For urgent or emergent issues, a gastroenterologist can be reached at any hour by calling [phone].   DIET:  We do recommend a small meal at first, but then you may proceed to your regular diet.  Drink  plenty of fluids but you should avoid alcoholic beverages for 24 hours.  ACTIVITY:  You should plan to take it easy for the rest of today and you should NOT DRIVE or use heavy machinery until tomorrow (because of the sedation medicines used during the test).    FOLLOW UP: Our staff will call the number listed on your records the next business day following your procedure to check on you and address any questions or concerns that you may have regarding the information given to you following your procedure. If we do not reach you, we will leave a message.  However, if you are feeling well and you are not experiencing any problems, there is no need to return our call.  We will assume that you have returned to your regular daily activities without incident.  If any biopsies were taken you will be contacted by phone or by letter within the next 1-3 weeks.  Please call us at [phone] if you have not heard about the biopsies in 3 weeks.    SIGNATURES/CONFIDENTIALITY: You and/or your care partner have signed paperwork which will be entered into your electronic medical record.  These signatures attest to the fact that that the information above on your After Visit Summary has been reviewed and is understood.  Full responsibility of the confidentiality of this discharge information lies with you and/or your care-partner.     Handouts were given to your care partner on  polyps, diverticulosis, hemorrhoids, hemorrhoid banding information, and a hiatal hernia.. Adding Pantoprazole 40 mg daily.  Rx was sent to [facility].  Take each am 20 - 30 minutes before breakfast On an empty stomach.  Take for 1 month and call Dr. Hilarie Fredrickson and let him know if this rx has helped your symptoms. You may resume your other current medications today. Await biopsy results. Please call if any questions or concerns.

## 2017-06-02 NOTE — Op Note (Signed)
[facility] Patient Name: Tyshay Adee Procedure Date: 06/02/2017 3:31 PM MRN: [id number] Endoscopist: Jerene Bears , MD Age: 71 Referring MD:  Date of Birth: Nov 29, 1945 Gender: Female Account #: [id number] Procedure:                Colonoscopy Indications:              Abdominal pain in the right upper quadrant, Lower                            abdominal pain, Rectal bleeding, Change in bowel                            habits Medicines:                Monitored Anesthesia Care Procedure:                Pre-Anesthesia Assessment:                           - Prior to the procedure, a History and Physical                            was performed, and patient medications and                            allergies were reviewed. The patient's tolerance of                            previous anesthesia was also reviewed. The risks                            and benefits of the procedure and the sedation                            options and risks were discussed with the patient.                            All questions were answered, and informed consent                            was obtained. Prior Anticoagulants: The patient has                            taken no previous anticoagulant or antiplatelet                            agents. ASA Grade Assessment: II - A patient with                            mild systemic disease. After reviewing the risks                            and benefits, the patient was deemed in  satisfactory condition to undergo the procedure.                           After obtaining informed consent, the colonoscope                            was passed under direct vision. Throughout the                            procedure, the patient's blood pressure, pulse, and                            oxygen saturations were monitored continuously. The                            Model PCF-[id number]) scope was introduced                       through the anus and advanced to the the cecum,                            identified by appendiceal orifice and ileocecal                            valve. The colonoscopy was performed without                            difficulty. The patient tolerated the procedure                            well. The quality of the bowel preparation was                            good. The ileocecal valve, appendiceal orifice, and                            rectum were photographed. Scope In: 3:46:42 PM Scope Out: 4:01:23 PM Scope Withdrawal Time: 0 hours 12 minutes 21 seconds  Total Procedure Duration: 0 hours 14 minutes 41 seconds  Findings:                 The digital rectal exam was normal.                           The terminal ileum appeared normal.                           Two sessile polyps were found in the cecum. The                            polyps were 3 to 4 mm in size. These polyps were                            removed with a cold snare. Resection and retrieval  were complete.                           Two sessile polyps were found in the ascending                            colon. The polyps were 2 to 5 mm in size. These                            polyps were removed one with cold forceps, the                            other with a cold snare. Resection and retrieval                            were complete.                           A 5 mm polyp was found in the sigmoid colon. The                            polyp was sessile. The polyp was removed with a                            cold snare. Resection and retrieval were complete.                           Multiple small and large-mouthed diverticula were                            found in the sigmoid colon, descending colon and                            ascending colon.                           Internal hemorrhoids were found during retroflexion                            and  during digital exam. The hemorrhoids were                            medium-sized. Complications:            No immediate complications. Estimated Blood Loss:     Estimated blood loss was minimal. Impression:               - The examined portion of the ileum was normal.                           - Two 3 to 4 mm polyps in the cecum, removed with a                            cold snare. Resected and retrieved.                           -  Two 4 to 5 mm polyps in the ascending colon,                            removed with a cold forceps and cold snare.                            Resected and retrieved.                           - One 5 mm polyp in the sigmoid colon, removed with                            a cold snare. Resected and retrieved.                           - Moderate diverticulosis in the sigmoid colon, in                            the descending colon and in the ascending colon.                           - Internal hemorrhoids. Recommendation:           - Patient has a contact number available for                            emergencies. The signs and symptoms of potential                            delayed complications were discussed with the                            patient. Return to normal activities tomorrow.                            Written discharge instructions were provided to the                            patient.                           - Resume previous diet.                           - Continue present medications.                           - Await pathology results.                           - Repeat colonoscopy is recommended. The                            colonoscopy date will be determined after pathology  results from today's exam become available for                            review. Jerene Bears, MD 06/02/2017 4:25:15 PM This report has been signed electronically.

## 2017-06-03 ENCOUNTER — Telehealth: Payer: Self-pay | Admitting: *Deleted

## 2017-06-03 NOTE — Telephone Encounter (Signed)
  Follow up Call-  Call back number 06/02/2017  Post procedure Call Back phone  # [phone]  Permission to leave phone message Yes  Some recent data might be hidden     Patient questions:  Do you have a fever, pain , or abdominal swelling? No. Pain Score  0 *  Have you tolerated food without any problems? Yes.    Have you been able to return to your normal activities? Yes.    Do you have any questions about your discharge instructions: Diet   No. Medications  No. Follow up visit  No.  Do you have questions or concerns about your Care? No.  Actions: * If pain score is 4 or above: No action needed, pain <4.

## 2017-06-07 ENCOUNTER — Encounter: Payer: Self-pay | Admitting: Internal Medicine

## 2017-07-25 ENCOUNTER — Encounter: Payer: Self-pay | Admitting: *Deleted

## 2017-08-08 ENCOUNTER — Ambulatory Visit (INDEPENDENT_AMBULATORY_CARE_PROVIDER_SITE_OTHER): Payer: Medicare Other | Admitting: Internal Medicine

## 2017-08-08 ENCOUNTER — Encounter: Payer: Self-pay | Admitting: Internal Medicine

## 2017-08-08 VITALS — BP 112/70 | HR 74 | Ht 66.0 in | Wt 174.0 lb

## 2017-08-08 DIAGNOSIS — D126 Benign neoplasm of colon, unspecified: Secondary | ICD-10-CM

## 2017-08-08 DIAGNOSIS — K2100 Gastro-esophageal reflux disease with esophagitis, without bleeding: Secondary | ICD-10-CM

## 2017-08-08 DIAGNOSIS — K582 Mixed irritable bowel syndrome: Secondary | ICD-10-CM

## 2017-08-08 NOTE — Patient Instructions (Signed)
Follow up as needed.  If you are age 72 or older, your body mass index should be between 23-30. Your Body mass index is 28.08 kg/m. If this is out of the aforementioned range listed, please consider follow up with your Primary Care Provider.  If you are age 76 or younger, your body mass index should be between 19-25. Your Body mass index is 28.08 kg/m. If this is out of the aformentioned range listed, please consider follow up with your Primary Care Provider.

## 2017-08-08 NOTE — Progress Notes (Signed)
Subjective:    Patient ID: Donna Wells, female    DOB: Jul 25, 1946, 72 y.o.   MRN: [id number]  HPI Adora Yeh is a 72 year old female with a past medical history of GERD, colon polyps, probable IBS, internal hemorrhoids who is here for follow-up.  She was initially seen on 05/16/2017 to evaluate right upper quadrant pain, bilateral lower abdominal pain intermittent rectal bleeding and alternating bowel habits.  After this visit she had normal blood counts, iron studies and her celiac test was negative.  She came for upper endoscopy and colonoscopy which were performed on 06/02/2017.  EGD revealed a low-grade Schatzki's ring and a slightly irregular Z line which was biopsied.  A 3 cm hiatal hernia.  And a normal stomach and duodenum.  GE junction biopsy showed acute and chronic inflammation without Barrett's metaplasia.  There was no dysplasia.  Gastric biopsy showed mild chronic gastritis without activity.  No H. pylori.  Colonoscopy to the terminal ileum with good preparation showed 2 cecal polyps, 2 ascending polyps, sigmoid polyp and multiple diverticula in the ascending, sigmoid and descending colon.  Internal hemorrhoids were found and medium in size.  Pathology revealed adenomatous polyps without high-grade dysplasia or malignancy.  She was treated with probiotic and PPI which she has now discontinued.  She reports she is feeling much better.  She has had resolution of her abdominal pain and more normalization of her bowel habits.  She has elevated the head of her bed which she states has helped with some of her symptoms which she now attributes to hiatal hernia.  She has had more normal bowel habits with 1-2 formed stools per day.  She has increased the water in her diet and also exercising more regularly.  She has had one bleeding episode in the last couple of months which was painless red blood with wiping.  She attributes this to her hemorrhoids.  She wished to discuss hemorrhoidal banding  today which we did.   Review of Systems As per HPI, otherwise negative  Current Medications, Allergies, Past Medical History, Past Surgical History, Family History and Social History were reviewed in [employer] record.     Objective:   Physical Exam BP 112/70   Pulse 74   Ht 5\' 6"  (1.676 m)   Wt 174 lb (78.9 kg)   BMI 28.08 kg/m  Constitutional: Well-developed and well-nourished. No distress. HEENT: Normocephalic and atraumatic. Conjunctivae are normal.  No scleral icterus. Neck: Neck supple. Trachea midline. Cardiovascular: Normal rate, regular rhythm and intact distal pulses. Pulmonary/chest: Effort normal and breath sounds normal. No wheezing, rales or rhonchi. Abdominal: Soft, nontender, nondistended. Bowel sounds active throughout.  Extremities: no clubbing, cyanosis, or edema Neurological: Alert and oriented to person place and time. Skin: Skin is warm and dry. Psychiatric: Normal mood and affect. Behavior is normal.  CBC    Component Value Date/Time   WBC 4.5 05/16/2017 1116   RBC 4.27 05/16/2017 1116   HGB 13.5 05/16/2017 1116   HCT 40.4 05/16/2017 1116   PLT 221.0 05/16/2017 1116   MCV 94.6 05/16/2017 1116   MCHC 33.3 05/16/2017 1116   RDW 13.6 05/16/2017 1116   LYMPHSABS 1.6 05/16/2017 1116   MONOABS 0.3 05/16/2017 1116   EOSABS 0.1 05/16/2017 1116   BASOSABS 0.0 05/16/2017 1116   Celiac panel negative    Assessment & Plan:  72 year old female with a past medical history of GERD, colon polyps, probable IBS, internal hemorrhoids who is here for follow-up.  1.  GERD/hiatal hernia --she has some element of esophagitis and took pantoprazole for roughly 2 months.  The symptoms have improved and she discontinued therapy.  She prefers to remain off therapy for now given overall improvement and lack of symptoms.  There was no evidence of Barrett's esophagus and therefore no reason to continue PPI therapy in the absence of symptoms.  She is  asked to notify me if she has recurrent upper abdominal pain, or develops heartburn, indigestion or trouble swallowing.  She voices understanding  2.  Alternating bowel habits --likely irritable bowel related.  The symptoms have improved.  She took probiotic, VSL 3, with success but is now off.  This can be used again in the future if needed.  3.  Adenomatous colon polyps --3-year surveillance colonoscopy recommended.  She is aware of this recommendation.  She can follow-up on an as-needed basis  15 minutes spent with the patient today. Greater than 50% was spent in counseling and coordination of care with the patient

## 2019-04-05 ENCOUNTER — Ambulatory Visit: Payer: Medicare Other | Admitting: Physician Assistant

## 2019-05-18 ENCOUNTER — Encounter: Payer: Self-pay | Admitting: *Deleted

## 2019-05-30 ENCOUNTER — Encounter: Payer: Self-pay | Admitting: Internal Medicine

## 2019-05-30 ENCOUNTER — Ambulatory Visit (INDEPENDENT_AMBULATORY_CARE_PROVIDER_SITE_OTHER): Payer: Medicare Other | Admitting: Internal Medicine

## 2019-05-30 ENCOUNTER — Other Ambulatory Visit (INDEPENDENT_AMBULATORY_CARE_PROVIDER_SITE_OTHER): Payer: Medicare Other

## 2019-05-30 VITALS — BP 128/70 | HR 96 | Temp 98.7°F | Ht 64.5 in | Wt 175.1 lb

## 2019-05-30 DIAGNOSIS — K602 Anal fissure, unspecified: Secondary | ICD-10-CM

## 2019-05-30 DIAGNOSIS — K648 Other hemorrhoids: Secondary | ICD-10-CM | POA: Diagnosis not present

## 2019-05-30 DIAGNOSIS — K625 Hemorrhage of anus and rectum: Secondary | ICD-10-CM

## 2019-05-30 DIAGNOSIS — E538 Deficiency of other specified B group vitamins: Secondary | ICD-10-CM | POA: Diagnosis not present

## 2019-05-30 DIAGNOSIS — Z86010 Personal history of colonic polyps: Secondary | ICD-10-CM

## 2019-05-30 LAB — CBC WITH DIFFERENTIAL/PLATELET
Basophils Absolute: 0 10*3/uL (ref 0.0–0.1)
Basophils Relative: 0.7 % (ref 0.0–3.0)
Eosinophils Absolute: 0.1 10*3/uL (ref 0.0–0.7)
Eosinophils Relative: 1.8 % (ref 0.0–5.0)
HCT: 41.4 % (ref 36.0–46.0)
Hemoglobin: 13.7 g/dL (ref 12.0–15.0)
Lymphocytes Relative: 32.7 % (ref 12.0–46.0)
Lymphs Abs: 1.6 10*3/uL (ref 0.7–4.0)
MCHC: 33.2 g/dL (ref 30.0–36.0)
MCV: 93.4 fl (ref 78.0–100.0)
Monocytes Absolute: 0.4 10*3/uL (ref 0.1–1.0)
Monocytes Relative: 7.4 % (ref 3.0–12.0)
Neutro Abs: 2.8 10*3/uL (ref 1.4–7.7)
Neutrophils Relative %: 57.4 % (ref 43.0–77.0)
Platelets: 192 10*3/uL (ref 150.0–400.0)
RBC: 4.43 Mil/uL (ref 3.87–5.11)
RDW: 13.8 % (ref 11.5–15.5)
WBC: 4.8 10*3/uL (ref 4.0–10.5)

## 2019-05-30 MED ORDER — DILTIAZEM GEL 2 %: 1.0000 "application " | Freq: Two times a day (BID) | CUTANEOUS | 1 refills | Status: DC

## 2019-05-30 NOTE — Progress Notes (Signed)
Subjective:    Patient ID: Donna Wells, female    DOB: Feb 12, 1946, 73 y.o.   MRN: [id number]  HPI Shryl Fehrmann is a 73 year old female with a history of adenomatous colon polyps, colonic diverticulosis, hemorrhoids, GERD who is seen for follow-up to discuss rectal bleeding.  She is here alone today.  She was last seen on 08/08/2017.  She had an upper endoscopy and colonoscopy performed on 06/02/2017 which were reviewed today.  She reports that over the last 2 to 3 months she has had 3-4 episodes of bright red rectal bleeding.  Several of these episodes she reports had "a lot of red blood".  Always associated with bowel movement.  Seen with wiping and in the toilet water.  Occasionally associated with lower abdominal crampiness.  Blood would occur for a day or so with bowel movement and then resolved.  Seem to get better when she rested and stayed off of her feet.  Moderate pain with passing stool but no rectal pain afterward.  Saw small amount of blood yesterday with bowel movement.  Denies significant diarrhea or constipation.  Her reflux symptoms have been under good control of late.  No dysphagia or odynophagia.  She did have primary care check a blood count after an episode of rectal bleeding.  She brings a copy of this today.  Her hemoglobin was 13.6 on 03/29/2019.  This was compared to 13.8 on 06/05/2018.   Review of Systems As per HPI, otherwise negative  Current Medications, Allergies, Past Medical History, Past Surgical History, Family History and Social History were reviewed in [employer] record.     Objective:   Physical Exam BP 128/70 (BP Location: Left Arm, Patient Position: Sitting, Cuff Size: Normal)   Pulse 96   Temp 98.7 F (37.1 C)   Ht 5' 4.5" (1.638 m) Comment: height measured without shoes  Wt 175 lb 2 oz (79.4 kg)   BMI 29.60 kg/m  Gen: awake, alert, NAD HEENT: anicteric, CV: RRR, no mrg Pulm: CTA b/l Abd: soft, NT/ND, +BS throughout  Rectal: no external lesions or rash, anal fissure visible left posterior, no masses, exam mildly tender. ANOSCOPY: Using a disposable, lubricated, slotted, self-illuminating anoscope, the rectum was intubated without difficulty. The trochar was removed and the ano-rectum was circumferentially inspected. There were medium-sided and inflamed internal hemorrhoids, RA and LL.  No neoplasia or other pathology was identified. The inspection was well tolerated.  Ext: no c/c/e Neuro: nonfocal      Assessment & Plan:  73 year old female with a history of adenomatous colon polyps, colonic diverticulosis, hemorrhoids, GERD who is seen for follow-up to discuss rectal bleeding.    1. Rectal bleeding with associated internal hemorrhoid and anal fissure --we discussed the pathology found today.  I would like her to use Preparation H suppositories at bedtime for the internal hemorrhoids and also diltiazem gel and RectiCare for the anal fissure.  We discussed hemorrhoidal banding at length today and if this continues to be an ongoing problem, as I expect it might, hemorrhoidal banding should be more definitive therapy.  She will take literature and consider if hemorrhoidal bleeding continues. --Preparation H suppository nightly x3 to 5 days and then as needed --Diltiazem gel 2% twice daily apply a pea-sized amount to the first knuckle in the anal canal --RectiCare to the anal canal per box instruction --Hemorrhoidal banding if hemorrhoidal bleeding or symptomatic hemorrhoids persist --CBC stable 2 months ago, repeat today for reassurance  2.  History of  adenomatous colon polyps --surveillance colonoscopy due next November, 2021  3.  Borderline B12 deficiency --B12 level when checked by primary care in August was 200 with the lower limit of normal being 180.  I recommended she begin oral supplementation 1000 mcg daily.  She can follow-up in the office as needed  25 minutes spent with the patient today. Greater  than 50% was spent in counseling and coordination of care with the patient

## 2019-05-30 NOTE — Patient Instructions (Addendum)
We have given you a prescription for the following medications to take to your pharmacy at your convenience: Diltiazem gel 2%- Using your index finger, apply a small amount of medication inside the rectum up to your first knuckle/joint twice daily x 4 weeks.  Please purchase the following medications over the counter and take as directed: Preparation H suppositories-insert 1 suppository every night x 3-5 nights, then as needed  Recticare-Apply to the rectum as needed  Your provider has requested that you go to the basement level for lab work before leaving today. Press "B" on the elevator. The lab is located at the first door on the left as you exit the elevator.  If you are age 59 or older, your body mass index should be between 23-30. Your Body mass index is 29.6 kg/m. If this is out of the aforementioned range listed, please consider follow up with your Primary Care Provider.  If you are age 69 or younger, your body mass index should be between 19-25. Your Body mass index is 29.6 kg/m. If this is out of the aformentioned range listed, please consider follow up with your Primary Care Provider.

## 2019-08-07 ENCOUNTER — Telehealth: Payer: Self-pay | Admitting: Internal Medicine

## 2019-08-07 NOTE — Telephone Encounter (Signed)
Ok for hemorrhoidal banding appts, would schedule at least 2 visit, each at least 3 weeks apart Thanks

## 2019-08-07 NOTE — Telephone Encounter (Signed)
See note below from Dr. Hilarie Fredrickson regarding scheduling.

## 2019-08-07 NOTE — Telephone Encounter (Signed)
Pt would like to schedule hem banding, ?ok to schedule banding. Please advise.

## 2019-08-07 NOTE — Telephone Encounter (Signed)
LM On vmail to call back to schedule hemorrhoid banding appt.

## 2019-08-24 ENCOUNTER — Ambulatory Visit (INDEPENDENT_AMBULATORY_CARE_PROVIDER_SITE_OTHER): Payer: Medicare Other | Admitting: Internal Medicine

## 2019-08-24 ENCOUNTER — Encounter: Payer: Self-pay | Admitting: Internal Medicine

## 2019-08-24 VITALS — BP 130/68 | HR 68 | Temp 98.3°F | Ht 64.5 in | Wt 176.5 lb

## 2019-08-24 DIAGNOSIS — K648 Other hemorrhoids: Secondary | ICD-10-CM

## 2019-08-24 NOTE — Patient Instructions (Addendum)
You are scheduled for your 2nd hemorrhoidal banding on 09/13/19 at 11:00 am  [place]   1. The procedure you have had should have been relatively painless since the banding of the area involved does not have nerve endings and there is no pain sensation.  The rubber band cuts off the blood supply to the hemorrhoid and the band may fall off as soon as 48 hours after the banding (the band may occasionally be seen in the toilet bowl following a bowel movement). You may notice a temporary feeling of fullness in the rectum which should respond adequately to plain Tylenol or Motrin.  2. Following the banding, avoid strenuous exercise that evening and resume full activity the next day.  A sitz bath (soaking in a warm tub) or bidet is soothing, and can be useful for cleansing the area after bowel movements.     3. To avoid constipation, take two tablespoons of natural wheat bran, natural oat bran, flax, Benefiber or any over the counter fiber supplement and increase your water intake to 7-8 glasses daily.    4. Unless you have been prescribed anorectal medication, do not put anything inside your rectum for two weeks: No suppositories, enemas, fingers, etc.  5. Occasionally, you may have more bleeding than usual after the banding procedure.  This is often from the untreated hemorrhoids rather than the treated one.  Don't be concerned if there is a tablespoon or so of blood.  If there is more blood than this, lie flat with your bottom higher than your head and apply an ice pack to the area. If the bleeding does not stop within a half an hour or if you feel faint, call our office at [phone] or go to the emergency room.  6. Problems are not common; however, if there is a substantial amount of bleeding, severe pain, chills, fever or difficulty passing urine (very rare) or other problems, you should call us at [phone] or report to the nearest emergency  room.  7. Do not stay seated continuously for more than 2-3 hours for a day or two after the procedure.  Tighten your buttock muscles 10-15 times every two hours and take 10-15 deep breaths every 1-2 hours.  Do not spend more than a few minutes on the toilet if you cannot empty your bowel; instead re-visit the toilet at a later time.

## 2019-08-24 NOTE — Progress Notes (Signed)
Donna Wells is a 74 year old female with a past medical history of colon polyps, colonic diverticulosis and hemorrhoids who is seen for hemorrhoidal banding.  I saw her on 05/30/2019 to discuss hemorrhoids but also treated anal fissure at that time.  Anal fissure symptoms have improved.  She is interested to pursue banding.  Hemorrhoidal symptoms include rectal bleeding and occasional prolapse   PROCEDURE NOTE:  The patient presents with symptomatic grade 2 internal hemorrhoids, requesting rubber band ligation of her hemorrhoidal disease.  All risks, benefits and alternative forms of therapy were described and informed consent was obtained.   The anorectum was pre-medicated with 0.125% nitroglycerin ointment The decision was made to band the LL internal hemorrhoid, and the [place] was used to perform band ligation without complication.   Digital anorectal examination was then performed to assure proper positioning of the band, and to adjust the banded tissue as required.  The patient was discharged home without pain or other issues.  Dietary and behavioral recommendations were given and along with follow-up instructions.     The patient will return as scheduled for  follow-up and possible additional banding as required. No complications were encountered and the patient tolerated the procedure well.

## 2019-09-13 ENCOUNTER — Other Ambulatory Visit: Payer: Self-pay

## 2019-09-13 ENCOUNTER — Ambulatory Visit (INDEPENDENT_AMBULATORY_CARE_PROVIDER_SITE_OTHER): Payer: Medicare Other | Admitting: Internal Medicine

## 2019-09-13 ENCOUNTER — Encounter: Payer: Self-pay | Admitting: Internal Medicine

## 2019-09-13 VITALS — BP 142/66 | HR 77 | Temp 98.5°F | Ht 65.5 in | Wt 176.0 lb

## 2019-09-13 DIAGNOSIS — K648 Other hemorrhoids: Secondary | ICD-10-CM

## 2019-09-13 NOTE — Progress Notes (Signed)
Donna Wells is a 74 year old female with a history of colon polyps, diverticulosis, anal fissure and hemorrhoids who presents after her initial hemorrhoidal banding on 08/24/2019  Initial hemorrhoidal banding went well.  Hemorrhoidal symptoms are better.  No further bleeding and less prolapse symptoms.  Hemorrhoidal symptoms prior to hemorrhoidal banding included rectal bleeding and occasional prolapse   PROCEDURE NOTE:  The patient presents with symptomatic grade 2 internal hemorrhoids, requesting rubber band ligation of her hemorrhoidal disease.  All risks, benefits and alternative forms of therapy were described and informed consent was obtained.   The anorectum was pre-medicated with 0.125% nitroglycerin ointment The decision was made to band the RA (LL banded on 08/24/2019) internal hemorrhoid, and the [place] was used to perform band ligation without complication.   Digital anorectal examination was then performed to assure proper positioning of the band, and to adjust the banded tissue as required.  The patient was discharged home without pain or other issues.  Dietary and behavioral recommendations were given and along with follow-up instructions.     The patient will return as needed for follow-up and possible additional banding as required. She is aware she will be due a repeat surveillance colonoscopy around [id number] No complications were encountered and the patient tolerated the procedure well.

## 2019-09-13 NOTE — Patient Instructions (Signed)
HEMORRHOID BANDING PROCEDURE    FOLLOW-UP CARE   1. The procedure you have had should have been relatively painless since the banding of the area involved does not have nerve endings and there is no pain sensation.  The rubber band cuts off the blood supply to the hemorrhoid and the band may fall off as soon as 48 hours after the banding (the band may occasionally be seen in the toilet bowl following a bowel movement). You may notice a temporary feeling of fullness in the rectum which should respond adequately to plain Tylenol or Motrin.  2. Following the banding, avoid strenuous exercise that evening and resume full activity the next day.  A sitz bath (soaking in a warm tub) or bidet is soothing, and can be useful for cleansing the area after bowel movements.     3. To avoid constipation, take two tablespoons of natural wheat bran, natural oat bran, flax, Benefiber or any over the counter fiber supplement and increase your water intake to 7-8 glasses daily.    4. Unless you have been prescribed anorectal medication, do not put anything inside your rectum for two weeks: No suppositories, enemas, fingers, etc.  5. Occasionally, you may have more bleeding than usual after the banding procedure.  This is often from the untreated hemorrhoids rather than the treated one.  Don't be concerned if there is a tablespoon or so of blood.  If there is more blood than this, lie flat with your bottom higher than your head and apply an ice pack to the area. If the bleeding does not stop within a half an hour or if you feel faint, call our office at [phone] or go to the emergency room.  6. Problems are not common; however, if there is a substantial amount of bleeding, severe pain, chills, fever or difficulty passing urine (very rare) or other problems, you should call us at [phone] or report to the nearest emergency room.  7. Do not stay seated continuously for more than 2-3 hours for a day or two  after the procedure.  Tighten your buttock muscles 10-15 times every two hours and take 10-15 deep breaths every 1-2 hours.  Do not spend more than a few minutes on the toilet if you cannot empty your bowel; instead re-visit the toilet at a later time.    If you are age 77 or older, your body mass index should be between 23-30. Your Body mass index is 28.84 kg/m. If this is out of the aforementioned range listed, please consider follow up with your Primary Care Provider.  If you are age 23 or younger, your body mass index should be between 19-25. Your Body mass index is 28.84 kg/m. If this is out of the aformentioned range listed, please consider follow up with your Primary Care Provider.

## 2019-12-24 ENCOUNTER — Other Ambulatory Visit: Payer: Self-pay | Admitting: Orthopedic Surgery

## 2019-12-24 DIAGNOSIS — R2232 Localized swelling, mass and lump, left upper limb: Secondary | ICD-10-CM

## 2020-01-16 ENCOUNTER — Ambulatory Visit
Admission: RE | Admit: 2020-01-16 | Discharge: 2020-01-16 | Disposition: A | Discharge status: Home / Self Care | Payer: Medicare Other | Source: Ambulatory Visit | Attending: Orthopedic Surgery | Admitting: Orthopedic Surgery

## 2020-01-16 DIAGNOSIS — R2232 Localized swelling, mass and lump, left upper limb: Secondary | ICD-10-CM

## 2020-04-11 ENCOUNTER — Encounter: Payer: Self-pay | Admitting: Internal Medicine

## 2020-06-02 ENCOUNTER — Ambulatory Visit (AMBULATORY_SURGERY_CENTER): Payer: Self-pay | Admitting: *Deleted

## 2020-06-02 ENCOUNTER — Other Ambulatory Visit: Payer: Self-pay

## 2020-06-02 VITALS — Ht 64.5 in | Wt 178.0 lb

## 2020-06-02 DIAGNOSIS — Z86010 Personal history of colonic polyps: Secondary | ICD-10-CM

## 2020-06-02 MED ORDER — NA SULFATE-K SULFATE-MG SULF 17.5-3.13-1.6 GM/177ML PO SOLN: ORAL | 0 refills | Status: DC

## 2020-06-02 NOTE — Progress Notes (Signed)
Patient is here in-person for PV. Patient denies any allergies to eggs or soy. Patient denies any problems with anesthesia/sedation. Patient denies any oxygen use at home. Patient denies taking any diet/weight loss medications or blood thinners. Patient is not being treated for MRSA or C-diff. Patient is aware of our care-partner policy and [id number]-[id number] safety protocol. COVID-19 vaccines completed on March 2021, per patient.

## 2020-06-16 ENCOUNTER — Ambulatory Visit (AMBULATORY_SURGERY_CENTER): Payer: Medicare Other | Admitting: Internal Medicine

## 2020-06-16 ENCOUNTER — Encounter: Payer: Self-pay | Admitting: Internal Medicine

## 2020-06-16 ENCOUNTER — Other Ambulatory Visit: Payer: Self-pay

## 2020-06-16 VITALS — BP 135/73 | HR 69 | Temp 97.3°F | Resp 11 | Ht 64.5 in | Wt 178.0 lb

## 2020-06-16 DIAGNOSIS — D125 Benign neoplasm of sigmoid colon: Secondary | ICD-10-CM | POA: Diagnosis not present

## 2020-06-16 DIAGNOSIS — D123 Benign neoplasm of transverse colon: Secondary | ICD-10-CM | POA: Diagnosis not present

## 2020-06-16 DIAGNOSIS — Z86010 Personal history of colonic polyps: Secondary | ICD-10-CM

## 2020-06-16 DIAGNOSIS — D122 Benign neoplasm of ascending colon: Secondary | ICD-10-CM

## 2020-06-16 MED ORDER — SODIUM CHLORIDE 0.9 % IV SOLN: 500.0000 mL | Freq: Once | INTRAVENOUS | Status: DC

## 2020-06-16 NOTE — Op Note (Signed)
[name] Patient Name: Donna Wells Procedure Date: 06/16/2020 7:37 AM MRN: [id number] Endoscopist: Jerene Bears , MD Age: 74 Referring MD:  Date of Birth: 1946-07-08 Gender: Female Account #: [id number] Procedure:                Colonoscopy Indications:              High risk colon cancer surveillance: Personal                            history of multiple (5) adenomas, Last colonoscopy:                            November 2018 Medicines:                Monitored Anesthesia Care Procedure:                Pre-Anesthesia Assessment:                           - Prior to the procedure, a History and Physical                            was performed, and patient medications and                            allergies were reviewed. The patient's tolerance of                            previous anesthesia was also reviewed. The risks                            and benefits of the procedure and the sedation                            options and risks were discussed with the patient.                            All questions were answered, and informed consent                            was obtained. Prior Anticoagulants: The patient has                            taken no previous anticoagulant or antiplatelet                            agents. ASA Grade Assessment: II - A patient with                            mild systemic disease. After reviewing the risks                            and benefits, the patient was deemed in  satisfactory condition to undergo the procedure.                           After obtaining informed consent, the colonoscope                            was passed under direct vision. Throughout the                            procedure, the patient's blood pressure, pulse, and                            oxygen saturations were monitored continuously. The                            Colonoscope was introduced through the anus and                             advanced to the cecum, identified by appendiceal                            orifice and ileocecal valve. The colonoscopy was                            performed without difficulty. The patient tolerated                            the procedure well. The quality of the bowel                            preparation was good. The ileocecal valve,                            appendiceal orifice, and rectum were photographed. Scope In: 8:06:29 AM Scope Out: 8:23:59 AM Scope Withdrawal Time: 0 hours 15 minutes 37 seconds  Total Procedure Duration: 0 hours 17 minutes 30 seconds  Findings:                 The digital rectal exam was normal.                           Two sessile polyps were found in the ascending                            colon. The polyps were 4 to 6 mm in size. These                            polyps were removed with a cold snare. Resection                            and retrieval were complete.                           A 6 mm polyp was found in the transverse  colon. The                            polyp was sessile. The polyp was removed with a                            cold snare. Resection and retrieval were complete.                           A 4 mm polyp was found in the sigmoid colon. The                            polyp was sessile. The polyp was removed with a                            cold snare. Resection and retrieval were complete.                           Multiple medium-mouthed diverticula were found in                            the sigmoid colon, descending colon and ascending                            colon.                           Internal hemorrhoids were found during                            retroflexion. The hemorrhoids were very small with                            post-banding scarring visible in the distal rectum. Complications:            No immediate complications. Estimated Blood Loss:     Estimated blood loss was  minimal. Impression:               - Two 4 to 6 mm polyps in the ascending colon,                            removed with a cold snare. Resected and retrieved.                           - One 6 mm polyp in the transverse colon, removed                            with a cold snare. Resected and retrieved.                           - One 4 mm polyp in the sigmoid colon, removed with                            a cold snare. Resected and  retrieved.                           - Diverticulosis in the sigmoid colon, in the                            descending colon and in the ascending colon.                           - Very small and improved internal hemorrhoids                            after banding treatments, small hemorrhoid now is                            right posterior. Recommendation:           - Patient has a contact number available for                            emergencies. The signs and symptoms of potential                            delayed complications were discussed with the                            patient. Return to normal activities tomorrow.                            Written discharge instructions were provided to the                            patient.                           - Resume previous diet.                           - Continue present medications.                           - Await pathology results.                           - Repeat colonoscopy is recommended for                            surveillance. The colonoscopy date will be                            determined after pathology results from today's                            exam become available for review. Jerene Bears, MD 06/16/2020 8:40:03 AM This report has been signed electronically.

## 2020-06-16 NOTE — Progress Notes (Signed)
pt tolerated well. VSS. awake and to recovery. Report given to RN.  

## 2020-06-16 NOTE — Progress Notes (Signed)
Lidocaine 2% 11ml IV given per Dr. Hilarie Fredrickson.

## 2020-06-16 NOTE — Patient Instructions (Signed)
Handouts Provided:  Polyps and Diverticulosis ° °YOU HAD AN ENDOSCOPIC PROCEDURE TODAY AT THE [facility]:   Refer to the procedure report that was given to you for any specific questions about what was found during the examination.  If the procedure report does not answer your questions, please call your gastroenterologist to clarify.  If you requested that your care partner not be given the details of your procedure findings, then the procedure report has been included in a sealed envelope for you to review at your convenience later. ° °YOU SHOULD EXPECT: Some feelings of bloating in the abdomen. Passage of more gas than usual.  Walking can help get rid of the air that was put into your GI tract during the procedure and reduce the bloating. If you had a lower endoscopy (such as a colonoscopy or flexible sigmoidoscopy) you may notice spotting of blood in your stool or on the toilet paper. If you underwent a bowel prep for your procedure, you may not have a normal bowel movement for a few days. ° °Please Note:  You might notice some irritation and congestion in your nose or some drainage.  This is from the oxygen used during your procedure.  There is no need for concern and it should clear up in a day or so. ° °SYMPTOMS TO REPORT IMMEDIATELY: ° °Following lower endoscopy (colonoscopy or flexible sigmoidoscopy): ° Excessive amounts of blood in the stool ° Significant tenderness or worsening of abdominal pains ° Swelling of the abdomen that is new, acute ° Fever of 100°F or higher ° °For urgent or emergent issues, a gastroenterologist can be reached at any hour by calling [phone]. °Do not use MyChart messaging for urgent concerns.  ° ° °DIET:  We do recommend a small meal at first, but then you may proceed to your regular diet.  Drink plenty of fluids but you should avoid alcoholic beverages for 24 hours. ° °ACTIVITY:  You should plan to take it easy for the rest of today and you should NOT DRIVE  or use heavy machinery until tomorrow (because of the sedation medicines used during the test).   ° °FOLLOW UP: °Our staff will call the number listed on your records 48-72 hours following your procedure to check on you and address any questions or concerns that you may have regarding the information given to you following your procedure. If we do not reach you, we will leave a message.  We will attempt to reach you two times.  During this call, we will ask if you have developed any symptoms of COVID 19. If you develop any symptoms (ie: fever, flu-like symptoms, shortness of breath, cough etc.) before then, please call [phone].  If you test positive for Covid 19 in the 2 weeks post procedure, please call and report this information to us.   ° °If any biopsies were taken you will be contacted by phone or by letter within the next 1-3 weeks.  Please call us at [phone] if you have not heard about the biopsies in 3 weeks.  ° ° °SIGNATURES/CONFIDENTIALITY: °You and/or your care partner have signed paperwork which will be entered into your electronic medical record.  These signatures attest to the fact that that the information above on your After Visit Summary has been reviewed and is understood.  Full responsibility of the confidentiality of this discharge information lies with you and/or your care-partner. ° °

## 2020-06-16 NOTE — Progress Notes (Signed)
Pt. Reports no change in her medical or surgical history since her pre-visit 06/02/2020.

## 2020-06-16 NOTE — Progress Notes (Signed)
Called to room to assist during endoscopic procedure.  Patient ID and intended procedure confirmed with present staff. Received instructions for my participation in the procedure from the performing physician.  

## 2020-06-18 ENCOUNTER — Telehealth: Payer: Self-pay | Admitting: *Deleted

## 2020-06-18 NOTE — Telephone Encounter (Signed)
°  Follow up Call-  Call back number 06/16/2020  Post procedure Call Back phone  # [phone]  Permission to leave phone message Yes  Some recent data might be hidden     Patient questions:  Do you have a fever, pain , or abdominal swelling? No. Pain Score  0 *  Have you tolerated food without any problems? Yes.    Have you been able to return to your normal activities? Yes.    Do you have any questions about your discharge instructions: Diet   No. Medications  No. Follow up visit  No.  Do you have questions or concerns about your Care? No.  Actions: * If pain score is 4 or above: No action needed, pain <4.  1. Have you developed a fever since your procedure? no  2.   Have you had an respiratory symptoms (SOB or cough) since your procedure? no  3.   Have you tested positive for COVID 19 since your procedure no  4.   Have you had any family members/close contacts diagnosed with the COVID 19 since your procedure?  no   If yes to any of these questions please route to Joylene John, RN and Joella Prince, RN

## 2020-06-19 ENCOUNTER — Encounter: Payer: Self-pay | Admitting: Internal Medicine

## 2020-12-06 IMAGING — US US EXTREM UP*L* LTD
1 series · 6 of 6 positions shown · non-contrast
Comparison: None.

CLINICAL DATA: Left index finger mass.

EXAM:
ULTRASOUND LEFT UPPER EXTREMITY LIMITED
TECHNIQUE: Ultrasound examination of the upper extremity soft tissues was
performed in the area of clinical concern.

[Series 1: us extrem up*left* ltd · 0.03mm/px · 6 of 6 slices shown]
[im 1/6]
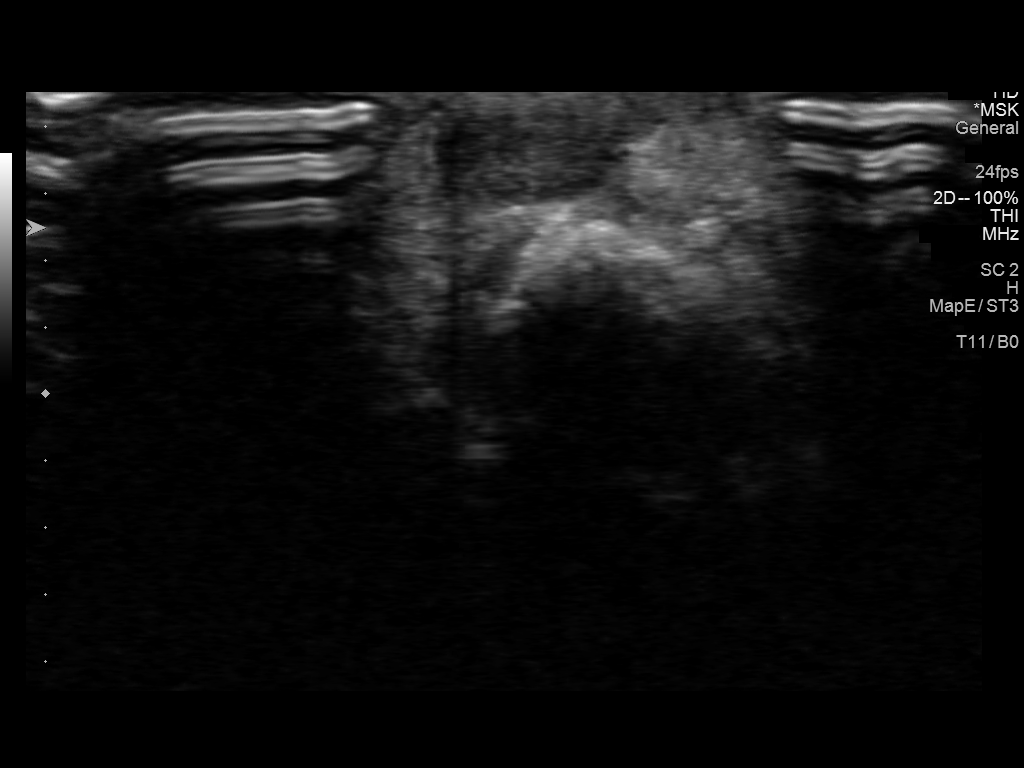
[im 2/6]
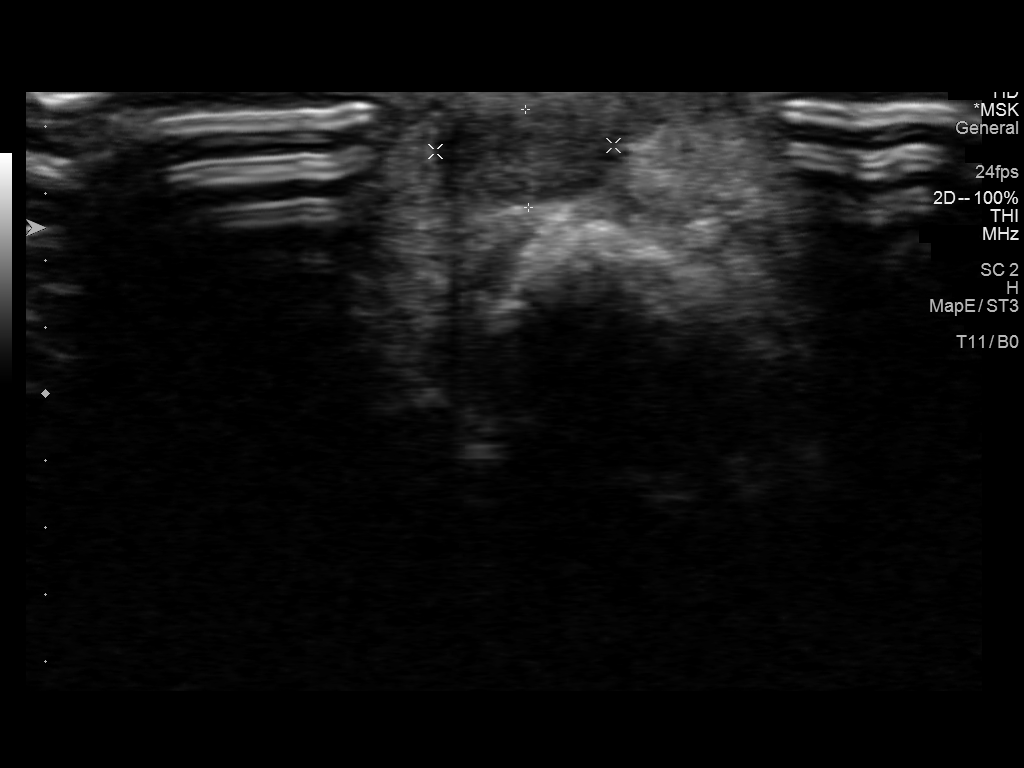
[im 3/6]
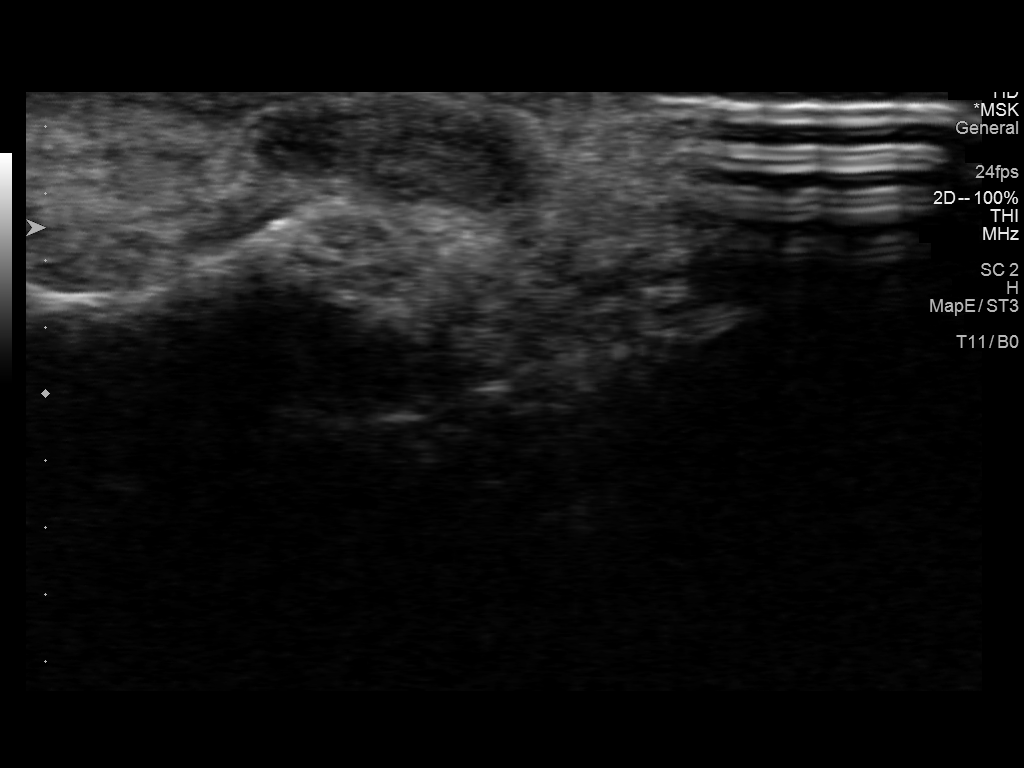
[im 4/6]
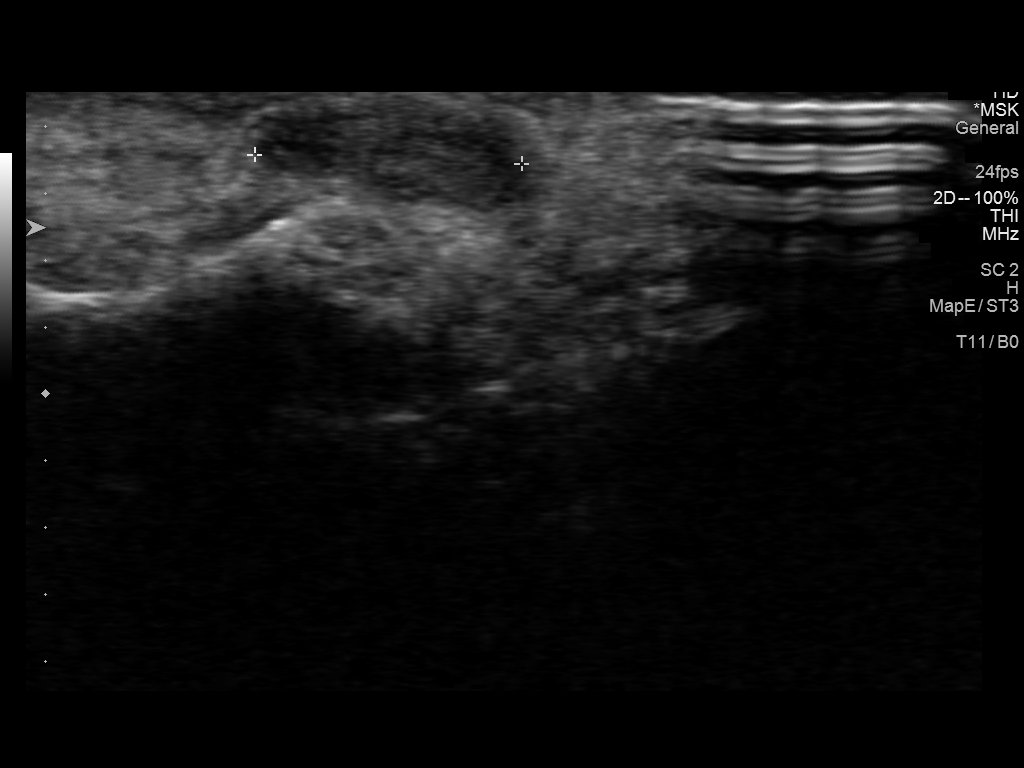
[im 5/6]
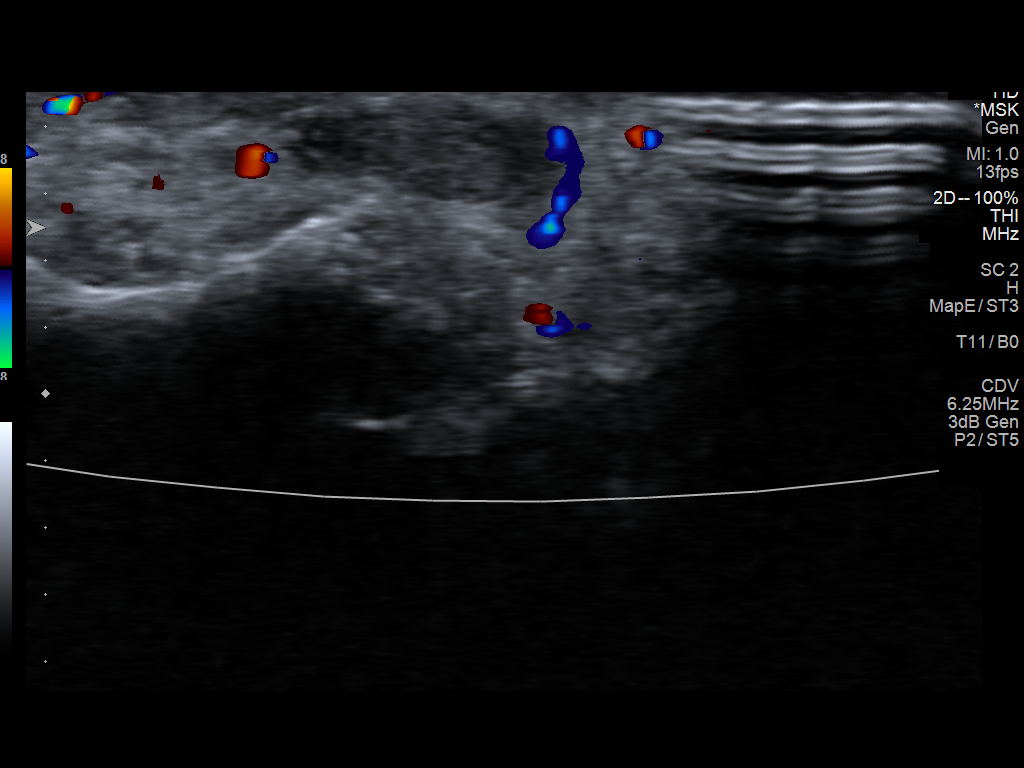
[im 6/6]
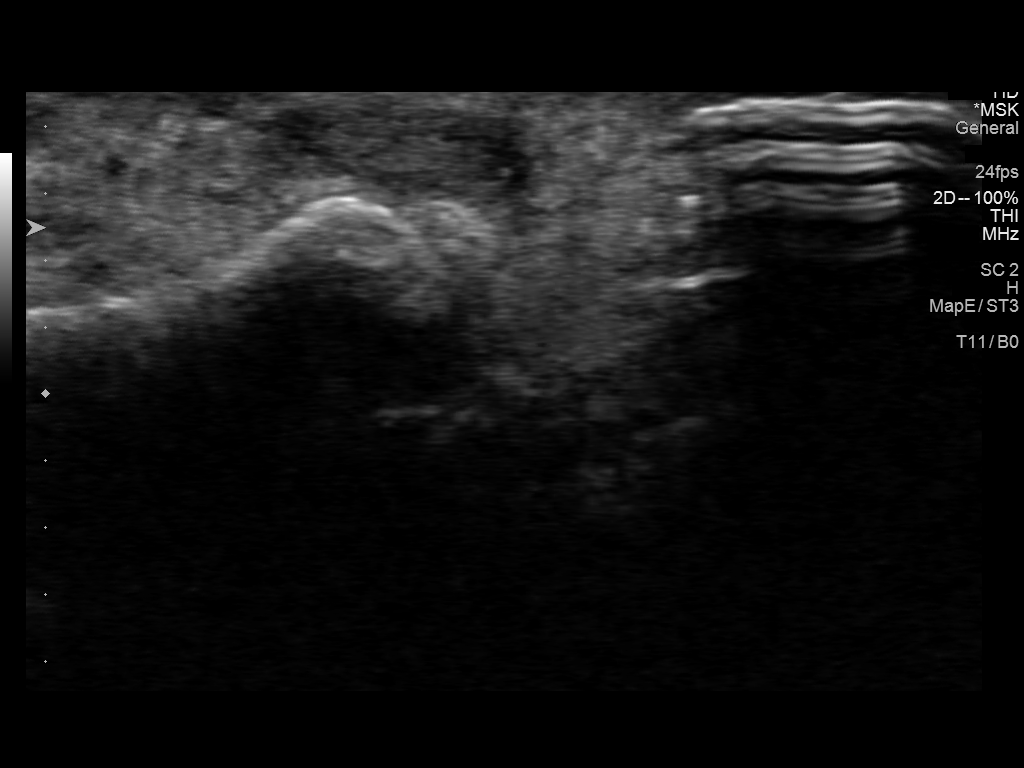

[6 of 6 positions shown; findings below may reference images not displayed]

FINDINGS: Focused ultrasound of the distal radial aspect of the left index
finger near the DIP joint demonstrates a well-defined 3 x 5 x 8 mm
hypoechoic mass with peripheral vascularity.
IMPRESSION: 8 mm solid mass of the distal index finger, nonspecific in
appearance, but likely a giant cell tumor of the tendon sheath.

## 2021-04-05 ENCOUNTER — Emergency Department (HOSPITAL_BASED_OUTPATIENT_CLINIC_OR_DEPARTMENT_OTHER)
Admission: EM | Admit: 2021-04-05 | Discharge: 2021-04-05 | Disposition: A | Discharge status: Home / Self Care | Payer: Medicare Other | Attending: Emergency Medicine | Admitting: Emergency Medicine

## 2021-04-05 ENCOUNTER — Encounter (HOSPITAL_BASED_OUTPATIENT_CLINIC_OR_DEPARTMENT_OTHER): Payer: Self-pay

## 2021-04-05 ENCOUNTER — Other Ambulatory Visit: Payer: Self-pay

## 2021-04-05 ENCOUNTER — Emergency Department (HOSPITAL_BASED_OUTPATIENT_CLINIC_OR_DEPARTMENT_OTHER): Payer: Medicare Other

## 2021-04-05 ENCOUNTER — Emergency Department (HOSPITAL_BASED_OUTPATIENT_CLINIC_OR_DEPARTMENT_OTHER): Payer: Medicare Other | Admitting: Radiology

## 2021-04-05 DIAGNOSIS — K219 Gastro-esophageal reflux disease without esophagitis: Secondary | ICD-10-CM | POA: Diagnosis not present

## 2021-04-05 DIAGNOSIS — K5900 Constipation, unspecified: Secondary | ICD-10-CM | POA: Insufficient documentation

## 2021-04-05 DIAGNOSIS — R109 Unspecified abdominal pain: Secondary | ICD-10-CM | POA: Diagnosis present

## 2021-04-05 LAB — CBC WITH DIFFERENTIAL/PLATELET
Abs Immature Granulocytes: 0 10*3/uL (ref 0.00–0.07)
Basophils Absolute: 0 10*3/uL (ref 0.0–0.1)
Basophils Relative: 1 %
Eosinophils Absolute: 0.1 10*3/uL (ref 0.0–0.5)
Eosinophils Relative: 2 %
HCT: 41.5 % (ref 36.0–46.0)
Hemoglobin: 13.7 g/dL (ref 12.0–15.0)
Immature Granulocytes: 0 %
Lymphocytes Relative: 27 %
Lymphs Abs: 1.2 10*3/uL (ref 0.7–4.0)
MCH: 31.1 pg (ref 26.0–34.0)
MCHC: 33 g/dL (ref 30.0–36.0)
MCV: 94.1 fL (ref 80.0–100.0)
Monocytes Absolute: 0.3 10*3/uL (ref 0.1–1.0)
Monocytes Relative: 7 %
Neutro Abs: 2.7 10*3/uL (ref 1.7–7.7)
Neutrophils Relative %: 63 %
Platelets: 171 10*3/uL (ref 150–400)
RBC: 4.41 MIL/uL (ref 3.87–5.11)
RDW: 13.4 % (ref 11.5–15.5)
WBC: 4.3 10*3/uL (ref 4.0–10.5)
nRBC: 0 % (ref 0.0–0.2)

## 2021-04-05 LAB — COMPREHENSIVE METABOLIC PANEL
ALT: 48 U/L — ABNORMAL HIGH (ref 0–44)
AST: 28 U/L (ref 15–41)
Albumin: 4.1 g/dL (ref 3.5–5.0)
Alkaline Phosphatase: 55 U/L (ref 38–126)
Anion gap: 8 (ref 5–15)
BUN: 11 mg/dL (ref 8–23)
CO2: 28 mmol/L (ref 22–32)
Calcium: 9.3 mg/dL (ref 8.9–10.3)
Chloride: 105 mmol/L (ref 98–111)
Creatinine, Ser: 0.77 mg/dL (ref 0.44–1.00)
GFR, Estimated: >60 mL/min (ref >60)
Glucose, Bld: 110 mg/dL — ABNORMAL HIGH (ref 70–99)
Potassium: 4 mmol/L (ref 3.5–5.1)
Sodium: 141 mmol/L (ref 135–145)
Total Bilirubin: 1.2 mg/dL (ref 0.3–1.2)
Total Protein: 7 g/dL (ref 6.5–8.1)

## 2021-04-05 LAB — URINALYSIS, ROUTINE W REFLEX MICROSCOPIC
Bilirubin Urine: NEGATIVE
Glucose, UA: NEGATIVE mg/dL
Hgb urine dipstick: NEGATIVE
Ketones, ur: NEGATIVE mg/dL
Nitrite: NEGATIVE
Protein, ur: NEGATIVE mg/dL
Specific Gravity, Urine: 1.033 — ABNORMAL HIGH (ref 1.005–1.030)
pH: 6.5 (ref 5.0–8.0)

## 2021-04-05 MED ORDER — IOHEXOL 350 MG/ML SOLN
100.0000 mL | Freq: Once | INTRAVENOUS | Status: AC | PRN
  Administered 2021-04-05: 75 mL via INTRAVENOUS

## 2021-04-05 MED ORDER — FLEET ENEMA 7-19 GM/118ML RE ENEM
1.0000 | ENEMA | Freq: Once | RECTAL | Status: AC
  Administered 2021-04-05: 1 via RECTAL
  Filled 2021-04-05: qty 1

## 2021-04-05 NOTE — ED Notes (Signed)
She c/o having "small" bowel movements all week last week. Wed. Of last week (four days ago) she experienced some low abd./rectal area pain and vomited a few times. She tells me she has no vomited since Wed. And also has been unable to have a b.m. since Wed., although at time she passes a small amt. Of rectal blood. She denies any hx of intra-abd. Surgery; however, she has had hemorrhoidectomy in the past.

## 2021-04-05 NOTE — Discharge Instructions (Addendum)
Use MiraLAX for the next 2 to 3 days.  Continue Metamucil after that.  Follow-up with your primary care doctor if he continues having any issues.  Return to emergency room if you have any worsening symptoms.

## 2021-04-05 NOTE — ED Notes (Signed)
Pt had a Very Large Successful Bowel Movement.

## 2021-04-05 NOTE — ED Provider Notes (Signed)
[facility] EMERGENCY DEPT Provider Note   CSN: [id number] Arrival date & time: 04/05/21  0736     History Chief Complaint  Patient presents with   Abdominal Pain   Constipation    Donna Wells is a 75 y.o. female.  Patient is a 75 year old female with a history of GERD, prior hepatitis C, kidney stones, hyperlipidemia who presents with constipation.  She said that she had some small hard stools last week and then she had 1 day where she was having some sharp pains in her abdomen associated with nausea and vomiting.  She said that she has not had any bowel movements for the last 4 days.  She has not had any vomiting over the last several days.  She also does have a history of hemorrhoids and over the last couple days she seen some blood when she wipes and a little bit of blood in the toilet.  She denies any dizziness or shortness of breath.  She does have some generalized abdominal pain that feels crampy in nature.  She denies any prior abdominal surgeries.      Past Medical History:  Diagnosis Date   Arthritis    Chronic gastritis    Diverticulosis    Elevated cholesterol    GERD (gastroesophageal reflux disease)    Hepatitis    Hep C at 75 yrs old   Hiatal hernia    Infectious hepatitis    age 49   Internal hemorrhoids    Kidney stones    Schatzki's ring    Stenosing tenosynovitis of finger of right hand    RRF   Tubular adenoma of colon     There are no problems to display for this patient.   Past Surgical History:  Procedure Laterality Date   ABDOMINAL HYSTERECTOMY  2009   CARPAL TUNNEL RELEASE Bilateral    COLONOSCOPY  06/02/2017   [facility]   HEMORROIDECTOMY  2008   KNEE ARTHROSCOPY Left    POLYPECTOMY     TRIGGER FINGER RELEASE Right 03/18/2015   Procedure: RELEASE TRIGGER FINGER/A-1 PULLEY RIGHT FING FINGER;  Surgeon: Daryll Brod, MD;  Location: [facility];  Service: Orthopedics;  Laterality: Right;  REG/FAB   UPPER  GASTROINTESTINAL ENDOSCOPY  06/02/2017   [facility]     OB History   No obstetric history on file.     Family History  Problem Relation Age of Onset   Ovarian cancer Daughter    Colon polyps Father    Colon cancer Neg Hx    Esophageal cancer Neg Hx    Rectal cancer Neg Hx    Stomach cancer Neg Hx     Social History   Tobacco Use   Smoking status: Never   Smokeless tobacco: Never  Vaping Use   Vaping Use: Never used  Substance Use Topics   Alcohol use: No   Drug use: No    Home Medications Prior to Admission medications   Medication Sig Start Date End Date Taking? Authorizing Provider  Calcium Carbonate-Vitamin D (CALTRATE 600+D PO) Take by mouth.    [provider]  Cyanocobalamin (VITAMIN B-12 PO) Place under the tongue.     [provider]  levothyroxine (SYNTHROID, LEVOTHROID) 50 MCG tablet Take by mouth. 05/31/17   [provider]  Vitamin D, Ergocalciferol, (DRISDOL) 50000 UNITS CAPS capsule Take 50,000 Units by mouth 2 (two) times a week.     [provider]    Allergies    Sulfa antibiotics  Review of Systems   Review of Systems  Constitutional:  Negative for chills, diaphoresis, fatigue and fever.  HENT:  Negative for congestion, rhinorrhea and sneezing.   Eyes: Negative.   Respiratory:  Negative for cough, chest tightness and shortness of breath.   Cardiovascular:  Negative for chest pain and leg swelling.  Gastrointestinal:  Positive for abdominal distention, abdominal pain, blood in stool, nausea and vomiting. Negative for diarrhea.  Genitourinary:  Negative for difficulty urinating, flank pain, frequency and hematuria.  Musculoskeletal:  Negative for arthralgias and back pain.  Skin:  Negative for rash.  Neurological:  Negative for dizziness, speech difficulty, weakness, numbness and headaches.   Physical Exam Updated Vital Signs BP 135/67   Pulse 66   Temp 98.5 F (36.9 C) (Oral)   Resp 15   SpO2 96%    Physical Exam Constitutional:      Appearance: She is well-developed.  HENT:     Head: Normocephalic and atraumatic.  Eyes:     Pupils: Pupils are equal, round, and reactive to light.  Cardiovascular:     Rate and Rhythm: Normal rate and regular rhythm.     Heart sounds: Normal heart sounds.  Pulmonary:     Effort: Pulmonary effort is normal. No respiratory distress.     Breath sounds: Normal breath sounds. No wheezing or rales.  Chest:     Chest wall: No tenderness.  Abdominal:     General: Bowel sounds are normal.     Palpations: Abdomen is soft.     Tenderness: There is generalized abdominal tenderness. There is no guarding or rebound.  Genitourinary:    Comments: No impaction, brown stool without obvious blood Musculoskeletal:        General: Normal range of motion.     Cervical back: Normal range of motion and neck supple.  Lymphadenopathy:     Cervical: No cervical adenopathy.  Skin:    General: Skin is warm and dry.     Findings: No rash.  Neurological:     Mental Status: She is alert and oriented to person, place, and time.    ED Results / Procedures / Treatments   Labs (all labs ordered are listed, but only abnormal results are displayed) Labs Reviewed  COMPREHENSIVE METABOLIC PANEL - Abnormal; Notable for the following components:      Result Value   Glucose, Bld 110 (*)    ALT 48 (*)    All other components within normal limits  URINALYSIS, ROUTINE W REFLEX MICROSCOPIC - Abnormal; Notable for the following components:   Color, Urine COLORLESS (*)    Specific Gravity, Urine 1.033 (*)    Leukocytes,Ua TRACE (*)    All other components within normal limits  CBC WITH DIFFERENTIAL/PLATELET    EKG None  Radiology CT Abdomen Pelvis W Contrast  Result Date: 04/05/2021 CLINICAL DATA:  Bowel obstruction suspected EXAM: CT ABDOMEN AND PELVIS WITH CONTRAST TECHNIQUE: Multidetector CT imaging of the abdomen and pelvis was performed using the standard protocol  following bolus administration of intravenous contrast. CONTRAST:  82m OMNIPAQUE IOHEXOL 350 MG/ML SOLN COMPARISON:  Same day radiographs FINDINGS: Lower chest: No acute abnormality. Bibasilar hypoventilatory changes. Hepatobiliary: Diffuse hepatic steatosis. No gallstones, gallbladder wall thickening, or biliary dilatation. Pancreas: Unremarkable. No pancreatic ductal dilatation or surrounding inflammatory changes. Spleen: Two tiny rim calcified lesions in the spleen, either granulomas or posttraumatic/postinfectious cysts. Otherwise unremarkable. Adrenals/Urinary Tract: Adrenal glands are unremarkable. No hydronephrosis. There are nonobstructive bilateral renal stones, largest measuring 5 mm  on the left and 1-2 mm on the right. The bladder is unremarkable. Stomach/Bowel: Small hiatal hernia. The stomach is otherwise within normal limits. There is no evidence of bowel obstruction. The appendix is normal. Mild stool burden in the colon with prominent stool ball in the rectum. Sigmoid diverticulosis without diverticulitis. Vascular/Lymphatic: Aortoiliac atherosclerotic calcifications. No AAA. No lymphadenopathy. Reproductive: Prior hysterectomy. Other: No abdominal wall hernia or abnormality. No abdominopelvic ascites. Musculoskeletal: Severe disc height loss at L5-S1 with trace degenerative grade 1 anterolisthesis. No acute osseous abnormality. There is a T12 vertebral body hemangioma. IMPRESSION: Mild stool burden throughout the colon with prominent stool ball in the rectum suggesting constipation. No evidence of bowel obstruction or other acute process in the abdomen or pelvis. Sigmoid diverticulosis without evidence of diverticulitis. Diffuse hepatic steatosis. Nonobstructive bilateral nephrolithiasis. Electronically Signed   By: Maurine Simmering M.D.   On: 04/05/2021 10:27   DG ABD ACUTE 2+V W 1V CHEST  Result Date: 04/05/2021 CLINICAL DATA:  Abdominal pain and constipation EXAM: DG ABDOMEN ACUTE WITH 1 VIEW  CHEST COMPARISON:  12/16/2009 FINDINGS: There is no evidence of dilated bowel loops or free intraperitoneal air. No radiopaque calculi or other significant radiographic abnormality is seen. Heart size and mediastinal contours are within normal limits. Both lungs are clear. IMPRESSION: No acute finding in the chest or abdomen. No abnormal stool retention. Electronically Signed   By: Monte Fantasia M.D.   On: 04/05/2021 09:06    Procedures Procedures   Medications Ordered in ED Medications  iohexol (OMNIPAQUE) 350 MG/ML injection 100 mL (75 mLs Intravenous Contrast Given 04/05/21 0950)  sodium phosphate (FLEET) 7-19 GM/118ML enema 1 enema (1 enema Rectal Given 04/05/21 1115)    ED Course  I have reviewed the triage vital signs and the nursing notes.  Pertinent labs & imaging results that were available during my care of the patient were reviewed by me and considered in my medical decision making (see chart for details).    MDM Rules/Calculators/A&P                           Patient is a 75 year old female who presents with left lower abdominal pain. She felt like it was associated with constipation.  She had acute abdominal series which showed no evidence of significant stool burden.  Her labs are nonconcerning.  However given her abdominal pain with negative x-ray, CT scan was performed which showed no acute abnormalities and there was a significant amount of constipation.  She was given a fleets enema with significant improvement.  She had no bloody stools.  She felt much better after this.  She was discharged home in good condition.  She was encouraged to use MiraLAX for the next 2 to 3 days and then continue Metamucil following that.  Return precautions were given. Final Clinical Impression(s) / ED Diagnoses Final diagnoses:  Constipation, unspecified constipation type    Rx / DC Orders ED Discharge Orders     None        Malvin Johns, MD 04/05/21 1210

## 2022-02-24 IMAGING — DX DG ABDOMEN ACUTE W/ 1V CHEST
2 series · 3 of 3 positions shown · non-contrast
Comparison: 12/16/2009

CLINICAL DATA: Abdominal pain and constipation

EXAM:
DG ABDOMEN ACUTE WITH 1 VIEW CHEST

[chest]
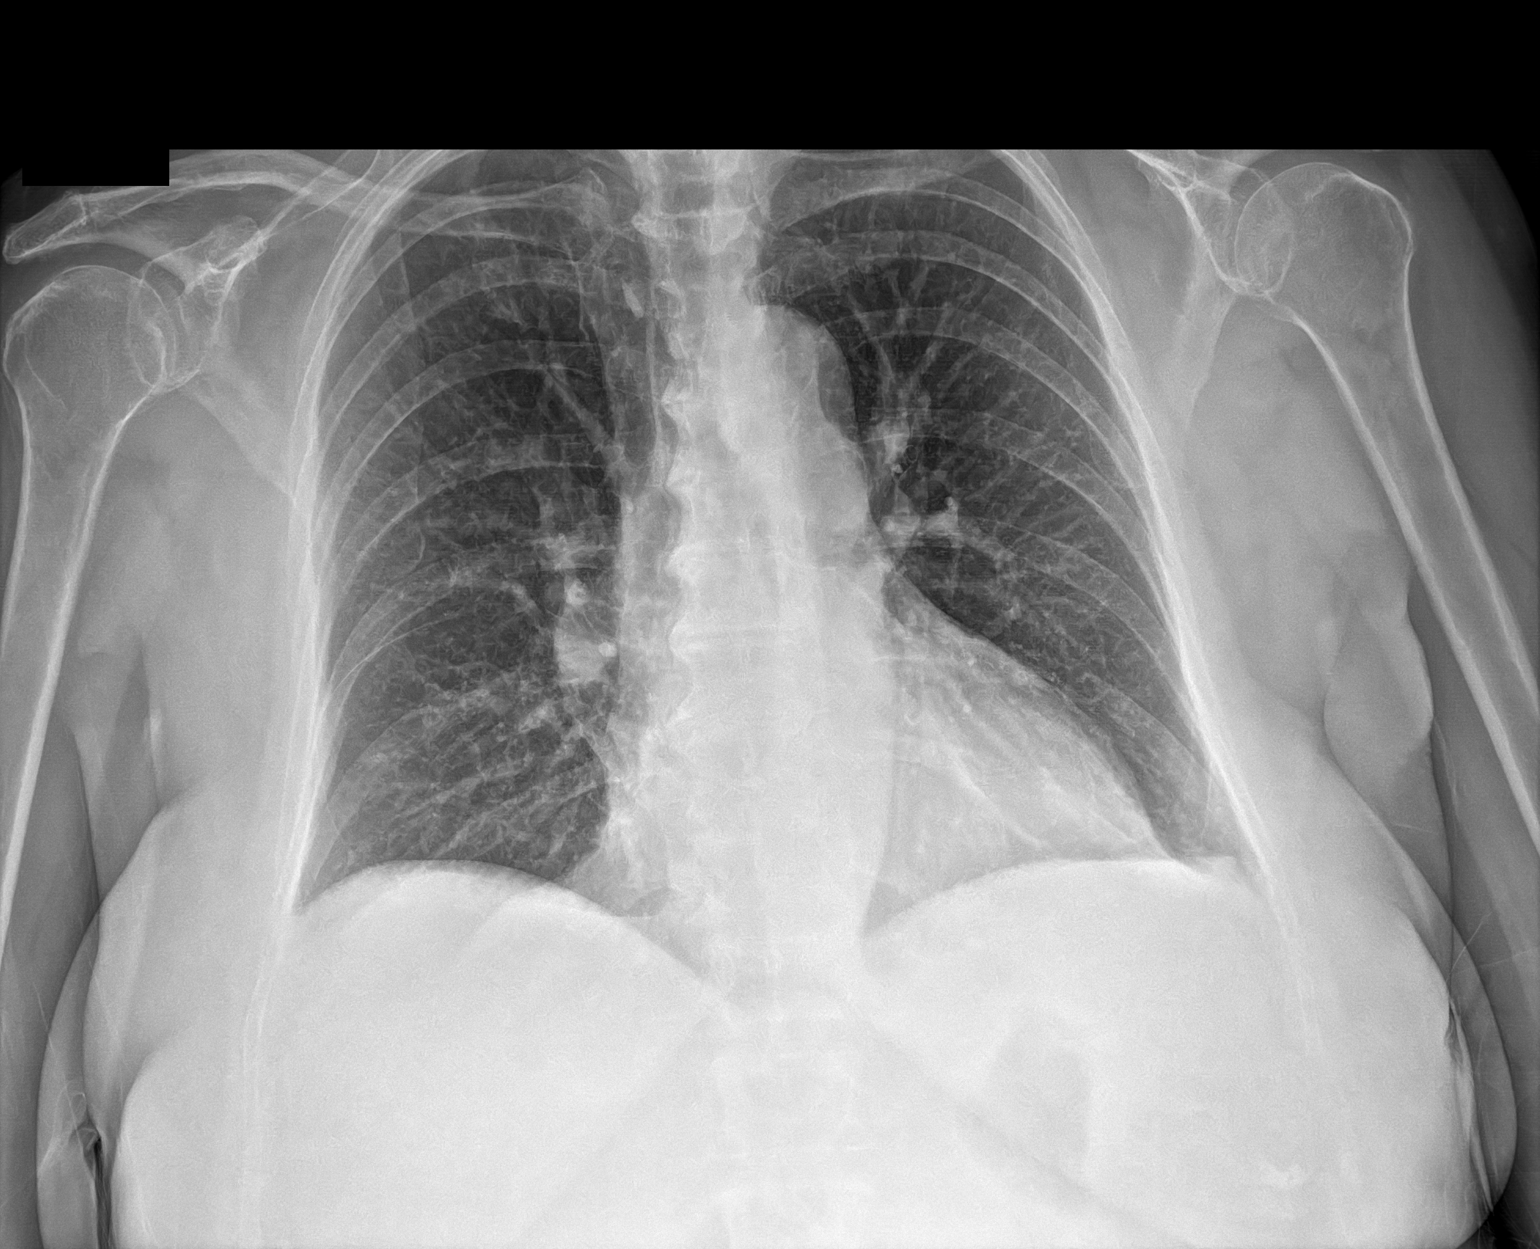

[Series 2: abdomen · 0.14mm/px · 2 of 2 slices shown]
[im 1/2]
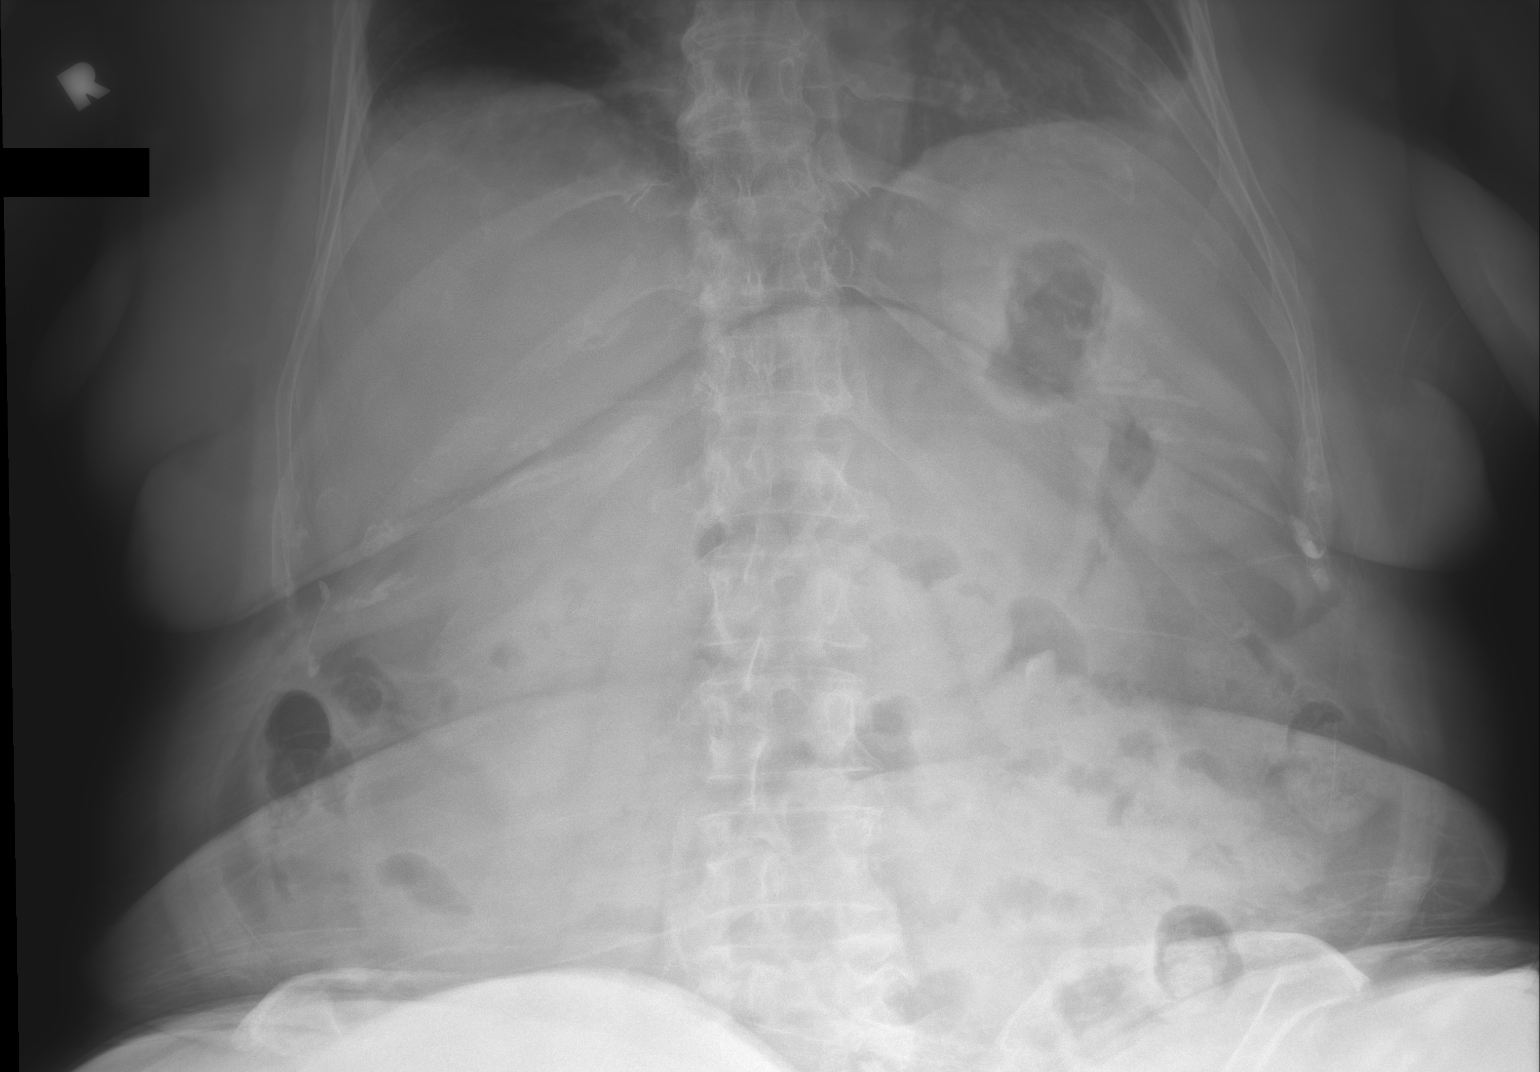
[im 2/2]
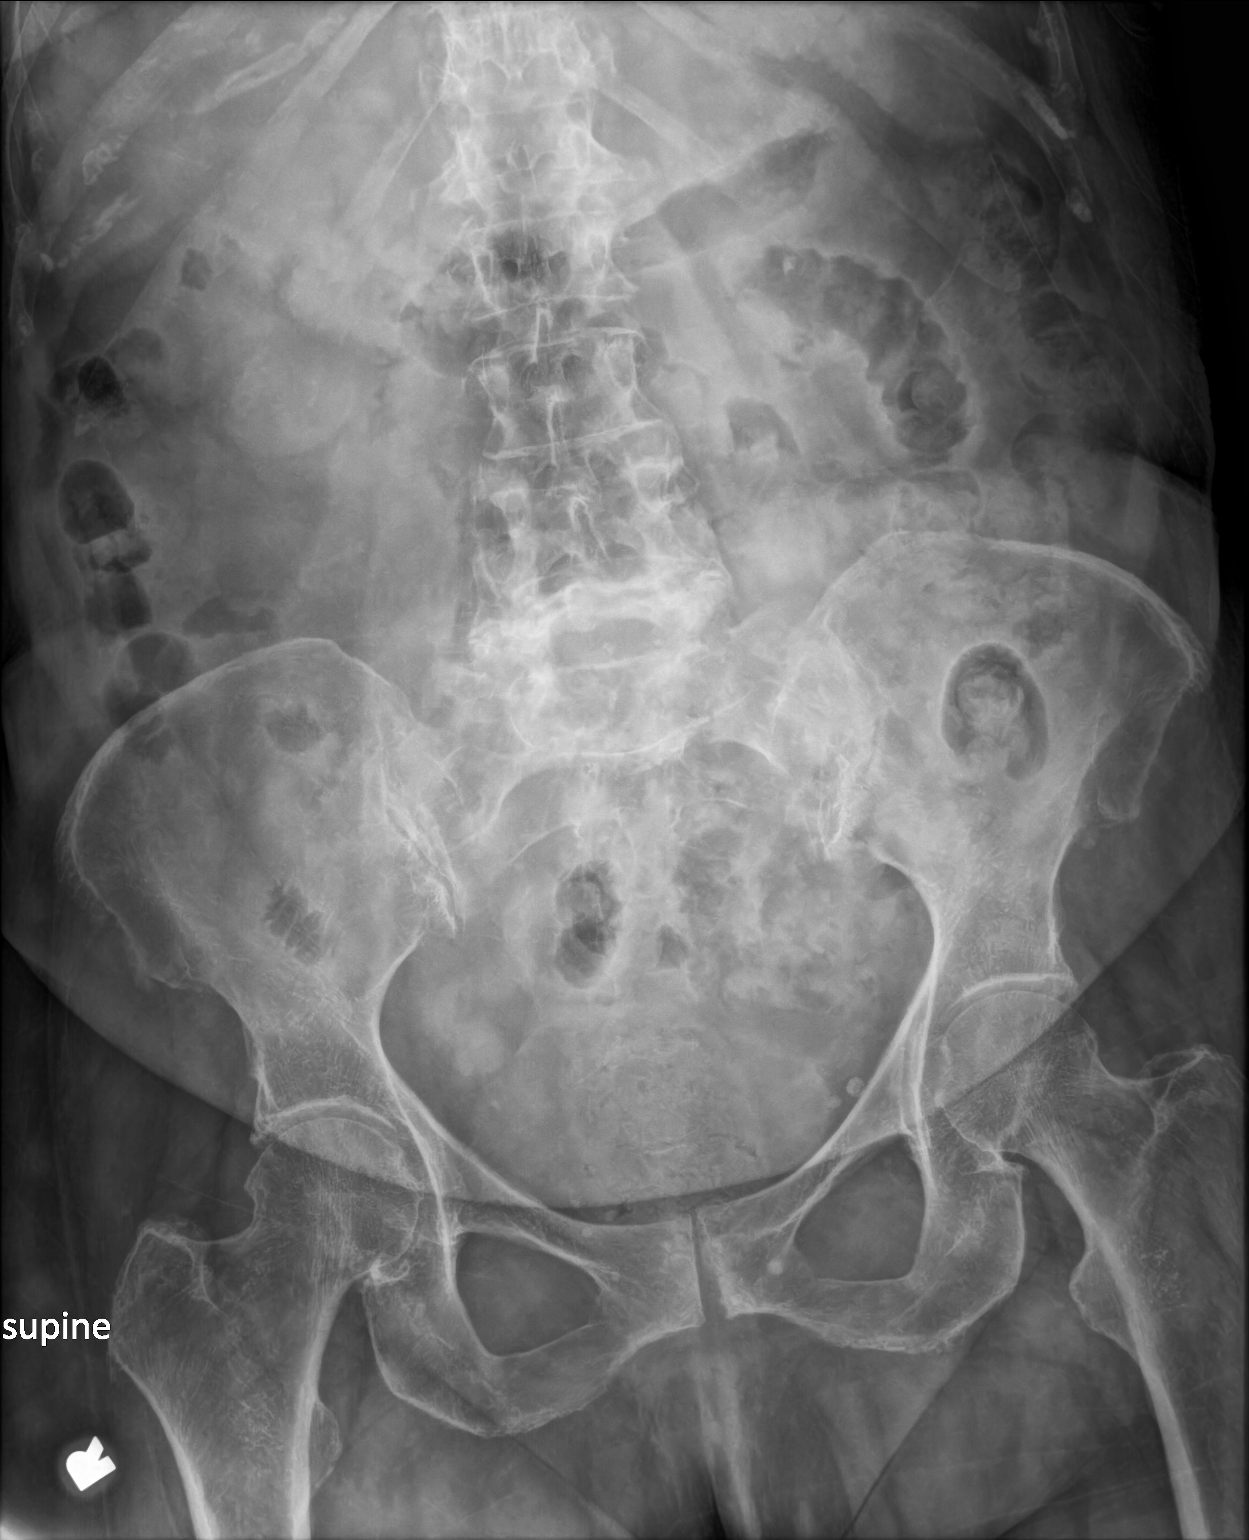

[3 of 3 positions shown; findings below may reference images not displayed]

FINDINGS: There is no evidence of dilated bowel loops or free intraperitoneal
air. No radiopaque calculi or other significant radiographic
abnormality is seen. Heart size and mediastinal contours are within
normal limits. Both lungs are clear.
IMPRESSION: No acute finding in the chest or abdomen. No abnormal stool
retention.

## 2023-05-06 ENCOUNTER — Ambulatory Visit (INDEPENDENT_AMBULATORY_CARE_PROVIDER_SITE_OTHER): Payer: Medicare Other | Admitting: Physician Assistant

## 2023-05-06 ENCOUNTER — Encounter: Payer: Self-pay | Admitting: Physician Assistant

## 2023-05-06 DIAGNOSIS — K5900 Constipation, unspecified: Secondary | ICD-10-CM | POA: Diagnosis not present

## 2023-05-06 DIAGNOSIS — R1319 Other dysphagia: Secondary | ICD-10-CM

## 2023-05-06 DIAGNOSIS — Z860101 Personal history of adenomatous and serrated colon polyps: Secondary | ICD-10-CM

## 2023-05-06 MED ORDER — NA SULFATE-K SULFATE-MG SULF 17.5-3.13-1.6 GM/177ML PO SOLN: 1.0000 | ORAL | 0 refills | Status: DC

## 2023-05-06 NOTE — Patient Instructions (Addendum)
_______________________________________________________  If your blood pressure at your visit was 140/90 or greater, please contact your primary care physician to follow up on this. _______________________________________________________  If you are age 77 or older, your body mass index should be between 23-30. Your Body mass index is 29.76 kg/m. If this is out of the aforementioned range listed, please consider follow up with your Primary Care Provider. ________________________________________________________  The [place] [facility] providers would like to encourage you to use [facility] to communicate with providers for non-urgent requests or questions.  Due to long hold times on the telephone, sending your provider a message by [facility] may be a faster and more efficient way to get a response.  Please allow 48 business hours for a response.  Please remember that this is for non-urgent requests.  _______________________________________________________  Please purchase the following medications over the counter and take as directed:  START: Miralax daily.  You have been scheduled for an endoscopy and colonoscopy. Please follow the written instructions given to you at your visit today.  Please pick up your prep supplies at the pharmacy within the next 1-3 days.  If you use inhalers (even only as needed), please bring them with you on the day of your procedure.  DO NOT TAKE 7 DAYS PRIOR TO TEST- Trulicity (dulaglutide) Ozempic, Wegovy (semaglutide) Mounjaro (tirzepatide) Bydureon Bcise (exanatide extended release)  DO NOT TAKE 1 DAY PRIOR TO YOUR TEST Rybelsus (semaglutide) Adlyxin (lixisenatide) Victoza (liraglutide) Byetta (exanatide) ___________________________________________________________________________  Due to recent changes in healthcare laws, you may see the results of your imaging and laboratory studies on MyChart before your provider has had a chance to review them.  We understand  that in some cases there may be results that are confusing or concerning to you. Not all laboratory results come back in the same time frame and the provider may be waiting for multiple results in order to interpret others.  Please give [place] 48 hours in order for your provider to thoroughly review all the results before contacting the office for clarification of your results.   Thank you for entrusting me with your care and choosing [facility].  Hyacinth Meeker, PA-C

## 2023-05-06 NOTE — Progress Notes (Signed)
Chief Complaint: Recall colonoscopy and discuss EGD  HPI:    Donna Wells is a 77 year old female with a past medical history as listed below, known to Dr. Rhea Belton, who presents to clinic today to discuss her recall colonoscopy and discuss EGD.    06/02/2017 EGD with a low-grade narrowing Schatzki's ring with slightly irregular Z-line and 3 cm hiatal hernia.  Dilated.    06/16/2020 colonoscopy done for high risk colon cancer surveillance with personal history of multiple adenomas (5) on colonoscopy November 2018.  That time to 4-6 mm polyps in the ascending colon, one 6 mm polyp in the transverse colon and one 4 mm polyp in the sigmoid colon as well as diverticulosis in the sigmoid, descending and ascending colon and small/improved internal hemorrhoids after banding treatments.  Pathology showed tubular adenomas and repeat recommended in 3 years.    12/30/2022 CMP, CBC, hemoglobin A1c and TSH all normal.  Iron studies normal.    Today, patient tells me that she knows she is due for a repeat colonoscopy given her history of polyps.  Tells me she has chronic constipation for which she uses Metamucil daily and occasionally MiraLAX about once a week but still feels like she is having some issues.  This has been going on for years.    Also discusses that she feels like some of her bigger pills are getting hung up in her throat again, apparently had a history of the similar symptoms prior to last EGD in 2018, at that time the scope naturally dilated a Schatzki's ring and she had felt well.  She wants to see if she can repeat EGD.    Denies fever, chills, weight loss, new cardiac or pulmonary issues.  Past Medical History:  Diagnosis Date   Arthritis    Chronic gastritis    Diverticulosis    Elevated cholesterol    GERD (gastroesophageal reflux disease)    Hepatitis    Hep C at 77 yrs old   Hiatal hernia    Infectious hepatitis    age 54   Internal hemorrhoids    Kidney stones    Schatzki's ring     Stenosing tenosynovitis of finger of right hand    RRF   Tubular adenoma of colon     Past Surgical History:  Procedure Laterality Date   ABDOMINAL HYSTERECTOMY  2009   CARPAL TUNNEL RELEASE Bilateral    COLONOSCOPY  06/02/2017   [facility]   HEMORROIDECTOMY  2008   KNEE ARTHROSCOPY Left    POLYPECTOMY     TRIGGER FINGER RELEASE Right 03/18/2015   Procedure: RELEASE TRIGGER FINGER/A-1 PULLEY RIGHT FING FINGER;  Surgeon: Cindee Salt, MD;  Location: [facility];  Service: Orthopedics;  Laterality: Right;  REG/FAB   UPPER GASTROINTESTINAL ENDOSCOPY  06/02/2017   [facility]    Current Outpatient Medications  Medication Sig Dispense Refill   Calcium Carbonate-Vitamin D (CALTRATE 600+D PO) Take by mouth.     Cyanocobalamin (VITAMIN B-12 PO) Place under the tongue.      levothyroxine (SYNTHROID, LEVOTHROID) 50 MCG tablet Take by mouth.     Vitamin D, Ergocalciferol, (DRISDOL) 50000 UNITS CAPS capsule Take 50,000 Units by mouth 2 (two) times a week.      No current facility-administered medications for this visit.    Allergies as of 05/06/2023 - Review Complete 04/05/2021  Allergen Reaction Noted   Sulfa antibiotics Rash 03/17/2015    Family History  Problem Relation Age of Onset   Ovarian cancer  Daughter    Colon polyps Father    Colon cancer Neg Hx    Esophageal cancer Neg Hx    Rectal cancer Neg Hx    Stomach cancer Neg Hx     Social History   Socioeconomic History   Marital status: Married    Spouse name: Not on file   Number of children: 2   Years of education: Not on file   Highest education level: Not on file  Occupational History   Not on file  Tobacco Use   Smoking status: Never   Smokeless tobacco: Never  Vaping Use   Vaping status: Never Used  Substance and Sexual Activity   Alcohol use: No   Drug use: No   Sexual activity: Yes    Birth control/protection: Surgical  Other Topics Concern   Not on file  Social History Narrative   Not on file    Social Determinants of Health   Financial Resource Strain: Not on file  Food Insecurity: Not on file  Transportation Needs: Not on file  Physical Activity: Not on file  Stress: Not on file  Social Connections: Not on file  Intimate Partner Violence: Not on file    Review of Systems:    Constitutional: No weight loss, fever or chills Skin: No rash  Cardiovascular: No chest pain  Respiratory: No SOB Gastrointestinal: See HPI and otherwise negative Genitourinary: No dysuria  Neurological: No headache, dizziness or syncope Musculoskeletal: No new muscle or joint pain Hematologic: No bleeding  Psychiatric: No history of depression or anxiety   Physical Exam:  Vital signs: BP 114/78 (BP Location: Left Arm, Patient Position: Sitting, Cuff Size: Normal)   Pulse 80   Ht 5' 4.5" (1.638 m)   Wt 176 lb 2 oz (79.9 kg)   BMI 29.76 kg/m    Constitutional:   Pleasant Elderly Caucasian female appears to be in NAD, Well developed, Well nourished, alert and cooperative Head:  Normocephalic and atraumatic. Eyes:   PEERL, EOMI. No icterus. Conjunctiva pink. Ears:  Normal auditory acuity. Neck:  Supple Throat: Oral cavity and pharynx without inflammation, swelling or lesion.  Respiratory: Respirations even and unlabored. Lungs clear to auscultation bilaterally.   No wheezes, crackles, or rhonchi.  Cardiovascular: Normal S1, S2. No MRG. Regular rate and rhythm. No peripheral edema, cyanosis or pallor.  Gastrointestinal:  Soft, nondistended, nontender. No rebound or guarding. Normal bowel sounds. No appreciable masses or hepatomegaly. Rectal:  Not performed.  Msk:  Symmetrical without gross deformities. Without edema, no deformity or joint abnormality.  Neurologic:  Alert and  oriented x4;  grossly normal neurologically.  Skin:   Dry and intact without significant lesions or rashes. Psychiatric: Demonstrates good judgement and reason without abnormal affect or behaviors.  RELEVANT LABS AND  IMAGING: CBC    Component Value Date/Time   WBC 4.3 04/05/2021 0833   RBC 4.41 04/05/2021 0833   HGB 13.7 04/05/2021 0833   HCT 41.5 04/05/2021 0833   PLT 171 04/05/2021 0833   MCV 94.1 04/05/2021 0833   MCH 31.1 04/05/2021 0833   MCHC 33.0 04/05/2021 0833   RDW 13.4 04/05/2021 0833   LYMPHSABS 1.2 04/05/2021 0833   MONOABS 0.3 04/05/2021 0833   EOSABS 0.1 04/05/2021 0833   BASOSABS 0.0 04/05/2021 0833    CMP     Component Value Date/Time   NA 141 04/05/2021 0833   K 4.0 04/05/2021 0833   CL 105 04/05/2021 0833   CO2 28 04/05/2021 0833   GLUCOSE  110 (H) 04/05/2021 0833   BUN 11 04/05/2021 0833   CREATININE 0.77 04/05/2021 0833   CALCIUM 9.3 04/05/2021 0833   PROT 7.0 04/05/2021 0833   ALBUMIN 4.1 04/05/2021 0833   AST 28 04/05/2021 0833   ALT 48 (H) 04/05/2021 0833   ALKPHOS 55 04/05/2021 0833   BILITOT 1.2 04/05/2021 0833   GFRNONAA >60 04/05/2021 0833   GFRAA  12/15/2009 1036    >60        The eGFR has been calculated using the MDRD equation. This calculation has not been validated in all clinical situations. eGFR's persistently <60 mL/min signify possible Chronic Kidney Disease.    Assessment: 1.  History of adenomatous polyps: Last colonoscopy in 2021 with multiple polyps, repeat recommended now 2.  Dysphagia: History of Schatzki's ring last dilated by endoscope in 2018, now with increasing issues mostly to pills; likely  Plan: 1.  Scheduled patient for repeat surveillance colonoscopy and diagnostic EGD with possible dilation in the [facility] with Dr. Rhea Belton.  Did provide the patient a detailed list of risks for the  procedures and she agrees to proceed. Patient is appropriate for endoscopic procedure(s) in the ambulatory ([facility]) setting.  2.  Patient will have a 2-day bowel prep given history of constipation. 3.  Recommend the patient start MiraLAX on a regular basis along with her Metamucil 4.  Patient to follow in clinic per recommendations from Dr. Rhea Belton  after time of procedures.  Donna Meeker, Donna Wells [facility] 05/06/2023, 10:41 AM  Cc: Theodoro Kos, MD

## 2023-05-11 NOTE — Progress Notes (Signed)
Addendum: Reviewed and agree with assessment and management plan. Kadijah Shamoon M, MD  

## 2023-07-05 ENCOUNTER — Emergency Department (HOSPITAL_BASED_OUTPATIENT_CLINIC_OR_DEPARTMENT_OTHER)
Admission: EM | Admit: 2023-07-05 | Discharge: 2023-07-05 | Disposition: A | Discharge status: Home / Self Care | Payer: Medicare Other | Attending: Emergency Medicine | Admitting: Emergency Medicine

## 2023-07-05 ENCOUNTER — Emergency Department (HOSPITAL_BASED_OUTPATIENT_CLINIC_OR_DEPARTMENT_OTHER): Payer: Medicare Other

## 2023-07-05 ENCOUNTER — Encounter (HOSPITAL_BASED_OUTPATIENT_CLINIC_OR_DEPARTMENT_OTHER): Payer: Self-pay | Admitting: *Deleted

## 2023-07-05 ENCOUNTER — Other Ambulatory Visit (HOSPITAL_BASED_OUTPATIENT_CLINIC_OR_DEPARTMENT_OTHER): Payer: Self-pay

## 2023-07-05 ENCOUNTER — Other Ambulatory Visit: Payer: Self-pay

## 2023-07-05 DIAGNOSIS — N201 Calculus of ureter: Secondary | ICD-10-CM

## 2023-07-05 DIAGNOSIS — R1032 Left lower quadrant pain: Secondary | ICD-10-CM | POA: Diagnosis present

## 2023-07-05 DIAGNOSIS — N132 Hydronephrosis with renal and ureteral calculous obstruction: Secondary | ICD-10-CM | POA: Insufficient documentation

## 2023-07-05 LAB — URINALYSIS, ROUTINE W REFLEX MICROSCOPIC
Bilirubin Urine: NEGATIVE
Glucose, UA: NEGATIVE mg/dL
Hgb urine dipstick: NEGATIVE
Ketones, ur: NEGATIVE mg/dL
Nitrite: NEGATIVE
Protein, ur: NEGATIVE mg/dL
Specific Gravity, Urine: 1.01 (ref 1.005–1.030)
pH: 7 (ref 5.0–8.0)

## 2023-07-05 LAB — COMPREHENSIVE METABOLIC PANEL
ALT: 22 U/L (ref 0–44)
AST: 20 U/L (ref 15–41)
Albumin: 4.5 g/dL (ref 3.5–5.0)
Alkaline Phosphatase: 59 U/L (ref 38–126)
Anion gap: 11 (ref 5–15)
BUN: 15 mg/dL (ref 8–23)
CO2: 26 mmol/L (ref 22–32)
Calcium: 10 mg/dL (ref 8.9–10.3)
Chloride: 103 mmol/L (ref 98–111)
Creatinine, Ser: 1.24 mg/dL — ABNORMAL HIGH (ref 0.44–1.00)
GFR, Estimated: 45 mL/min — ABNORMAL LOW (ref >60)
Glucose, Bld: 108 mg/dL — ABNORMAL HIGH (ref 70–99)
Potassium: 4.2 mmol/L (ref 3.5–5.1)
Sodium: 140 mmol/L (ref 135–145)
Total Bilirubin: 0.9 mg/dL (ref <1.2)
Total Protein: 7.3 g/dL (ref 6.5–8.1)

## 2023-07-05 LAB — URINALYSIS, MICROSCOPIC (REFLEX)

## 2023-07-05 LAB — CBC
HCT: 42 % (ref 36.0–46.0)
Hemoglobin: 14 g/dL (ref 12.0–15.0)
MCH: 32 pg (ref 26.0–34.0)
MCHC: 33.3 g/dL (ref 30.0–36.0)
MCV: 95.9 fL (ref 80.0–100.0)
Platelets: 186 10*3/uL (ref 150–400)
RBC: 4.38 MIL/uL (ref 3.87–5.11)
RDW: 13.8 % (ref 11.5–15.5)
WBC: 5.1 10*3/uL (ref 4.0–10.5)
nRBC: 0 % (ref 0.0–0.2)

## 2023-07-05 LAB — LIPASE, BLOOD: Lipase: 23 U/L (ref 11–51)

## 2023-07-05 MED ORDER — OXYCODONE HCL 5 MG PO TABS
5.0000 mg | ORAL_TABLET | Freq: Four times a day (QID) | ORAL | 0 refills | Status: AC | PRN
  Filled 2023-07-05: qty 5, 2d supply, fill #0

## 2023-07-05 MED ORDER — IOHEXOL 300 MG/ML  SOLN
100.0000 mL | Freq: Once | INTRAMUSCULAR | Status: AC | PRN
  Administered 2023-07-05: 80 mL via INTRAVENOUS

## 2023-07-05 MED ORDER — SODIUM CHLORIDE 0.9 % IV BOLUS
1000.0000 mL | Freq: Once | INTRAVENOUS | Status: AC
  Administered 2023-07-05: 1000 mL via INTRAVENOUS

## 2023-07-05 MED ORDER — ONDANSETRON 4 MG PO TBDP
4.0000 mg | ORAL_TABLET | Freq: Three times a day (TID) | ORAL | 0 refills | Status: AC | PRN
  Filled 2023-07-05: qty 20, 7d supply, fill #0

## 2023-07-05 NOTE — ED Triage Notes (Signed)
LLQ since Thursday with nausea and dry heaves.  LBM yesterday.  Pain increased after eating.  No urinary symptoms.

## 2023-07-05 NOTE — Discharge Instructions (Addendum)
You have been seen today for your complaint of left-sided abdominal pain. Your lab work was overall reassuring but did show slight elevation in your kidney function.  This is treated with IV fluids. Your imaging showed a kidney stone in your left ureter. Your discharge medications include oxycodone. This is an opioid pain medication. You should only take this medication as needed for severe pain. You should not drive, operate heavy machinery or make important decisions while taking this medication. You should use alternative methods for pain relief while taking this medication including stretching, gentle range of motion, and alternating tylenol and ibuprofen. Zofran.  This is a nausea medicine.  Take it as needed in order to eat and drink normal diet. Unfortunately you cannot take Flomax due to your sulfa allergy Follow up with: Dr. Alvester Morin.  He is a urologist.  Call to schedule a follow-up appointment regarding your kidney stone as it may not pass on its own. Please seek immediate medical care if you develop any of the following symptoms: You have a fever or chills. You develop severe pain. You develop new abdominal pain. You faint. You are unable to urinate. At this time there does not appear to be the presence of an emergent medical condition, however there is always the potential for conditions to change. Please read and follow the below instructions.  Do not take your medicine if  develop an itchy rash, swelling in your mouth or lips, or difficulty breathing; call 911 and seek immediate emergency medical attention if this occurs.  You may review your lab tests and imaging results in their entirety on your MyChart account.  Please discuss all results of fully with your primary care provider and other specialist at your follow-up visit.  Note: Portions of this text may have been transcribed using voice recognition software. Every effort was made to ensure accuracy; however, inadvertent  computerized transcription errors may still be present.

## 2023-07-05 NOTE — ED Provider Notes (Signed)
[place]  EMERGENCY DEPARTMENT AT [facility] Provider Note   CSN: [id number] Arrival date & time: 07/05/23  1139     History  Chief Complaint  Patient presents with   Abdominal Pain    Donna Wells is a 77 y.o. female.  With a history of chronic constipation, Schatzki's ring, previous hepatitis, diverticulosis, hypercholesterolemia, previous adenomas of the colon presenting to the ED for evaluation of left lower quadrant abdominal pain.  Symptoms have been present for approximately 5 days now.  Pain is intermittent and described as a sharp and stabbing sensation, typically postprandial in nature.  She reports some mild nausea at baseline but reports the pain causes an increased sensation of nausea.  No vomiting.  She reports normal bowel habits and has having approximately 1 bowel movement per day with the help of MiraLAX daily.  She denies any melena or hematochezia.  No dysuria, frequency, urgency, hematuria.  No back pain.  No fevers or chills.  She reports decreased oral intake due to the symptoms but she is still able to eat and drink daily.  She is still passing gas.  She states this is a new sensation for her and would "just like to get it checked out."   Abdominal Pain Associated symptoms: nausea        Home Medications Prior to Admission medications   Medication Sig Start Date End Date Taking? Authorizing Provider  ondansetron (ZOFRAN-ODT) 4 MG disintegrating tablet Take 1 tablet (4 mg total) by mouth every 8 (eight) hours as needed for nausea or vomiting. 07/05/23  Yes Dacoda Finlay, Edsel Petrin, PA-C  oxyCODONE (ROXICODONE) 5 MG immediate release tablet Take 1 tablet (5 mg total) by mouth every 6 (six) hours as needed for severe pain (pain score 7-10). 07/05/23  Yes Charmayne Odell, Edsel Petrin, PA-C  allopurinol (ZYLOPRIM) 300 MG tablet Take 300 mg by mouth daily. 02/28/23   [provider]  Calcium Carbonate-Vitamin D (CALTRATE 600+D PO) Take by mouth.    [provider]  clobetasol cream (TEMOVATE) 0.05 % Apply 1 Application topically 2 (two) times daily.    [provider]  colchicine 0.6 MG tablet Take 0.6 mg by mouth daily. 12/29/22   [provider]  Cyanocobalamin (VITAMIN B-12 PO) Place under the tongue.     [provider]  levothyroxine (SYNTHROID, LEVOTHROID) 50 MCG tablet Take by mouth. 05/31/17   [provider]  Na Sulfate-K Sulfate-Mg Sulf (SUPREP BOWEL PREP KIT) 17.5-3.13-1.6 GM/177ML SOLN Take 1 kit by mouth as directed. 05/06/23   Unk Lightning, PA  Vitamin D, Ergocalciferol, (DRISDOL) 50000 UNITS CAPS capsule Take 50,000 Units by mouth 2 (two) times a week.     [provider]      Allergies    Sulfa antibiotics    Review of Systems   Review of Systems  Gastrointestinal:  Positive for abdominal pain and nausea.  All other systems reviewed and are negative.   Physical Exam Updated Vital Signs BP (!) 167/81 (BP Location: Right Arm)   Pulse 83   Temp 98.8 F (37.1 C)   Resp 16   SpO2 98%  Physical Exam Vitals and nursing note reviewed.  Constitutional:      General: She is not in acute distress.    Appearance: She is well-developed.     Comments: Resting comfortably in bed  HENT:     Head: Normocephalic and atraumatic.  Eyes:     Conjunctiva/sclera: Conjunctivae normal.  Cardiovascular:  Rate and Rhythm: Normal rate and regular rhythm.     Heart sounds: No murmur heard. Pulmonary:     Effort: Pulmonary effort is normal. No respiratory distress.     Breath sounds: Normal breath sounds.  Abdominal:     General: Abdomen is flat. Bowel sounds are normal.     Palpations: Abdomen is soft.     Tenderness: There is generalized abdominal tenderness and tenderness in the right lower quadrant and epigastric area. There is no right CVA tenderness or left CVA tenderness.  Musculoskeletal:        General: No swelling.     Cervical back: Neck supple.  Skin:     General: Skin is warm and dry.     Capillary Refill: Capillary refill takes less than 2 seconds.  Neurological:     Mental Status: She is alert.  Psychiatric:        Mood and Affect: Mood normal.     ED Results / Procedures / Treatments   Labs (all labs ordered are listed, but only abnormal results are displayed) Labs Reviewed  COMPREHENSIVE METABOLIC PANEL - Abnormal; Notable for the following components:      Result Value   Glucose, Bld 108 (*)    Creatinine, Ser 1.24 (*)    GFR, Estimated 45 (*)    All other components within normal limits  URINALYSIS, ROUTINE W REFLEX MICROSCOPIC - Abnormal; Notable for the following components:   Leukocytes,Ua MODERATE (*)    All other components within normal limits  URINALYSIS, MICROSCOPIC (REFLEX) - Abnormal; Notable for the following components:   Bacteria, UA RARE (*)    All other components within normal limits  LIPASE, BLOOD  CBC    EKG None  Radiology CT ABDOMEN PELVIS W CONTRAST  Result Date: 07/05/2023 CLINICAL DATA:  Left lower quadrant pain EXAM: CT ABDOMEN AND PELVIS WITH CONTRAST TECHNIQUE: Multidetector CT imaging of the abdomen and pelvis was performed using the standard protocol following bolus administration of intravenous contrast. RADIATION DOSE REDUCTION: This exam was performed according to the departmental dose-optimization program which includes automated exposure control, adjustment of the mA and/or kV according to patient size and/or use of iterative reconstruction technique. CONTRAST:  80mL OMNIPAQUE IOHEXOL 300 MG/ML  SOLN COMPARISON:  04/05/2021 FINDINGS: Lower chest: No acute abnormality Hepatobiliary: Layering gallstones within the gallbladder. Diffuse low-density throughout the liver compatible with fatty infiltration. No focal hepatic abnormality or biliary ductal dilatation. Pancreas: No focal abnormality or ductal dilatation. Spleen: Calcifications spleen are unchanged.  Normal size. Adrenals/Urinary Tract:  Left hydronephrosis due to 6 mm proximal left ureteral stone. 3 mm nonobstructing stone in the lower pole of the right kidney. No hydronephrosis on the right. Adrenal glands and urinary bladder unremarkable. Stomach/Bowel: Sigmoid diverticulosis. No active diverticulitis. Stomach and small bowel decompressed, unremarkable. Normal appendix. Vascular/Lymphatic: Aortic atherosclerosis. No evidence of aneurysm or adenopathy. Reproductive: Prior hysterectomy.  No adnexal masses. Other: No free fluid or free air. Musculoskeletal: No acute bony abnormality. IMPRESSION: 6 mm proximal left ureteral stone with mild left hydronephrosis. Right lower pole nephrolithiasis.  No hydronephrosis. Sigmoid diverticulosis.  No active diverticulitis. Hepatic steatosis. Cholelithiasis. Aortic atherosclerosis. Electronically Signed   By: Charlett Nose M.D.   On: 07/05/2023 13:52    Procedures Procedures    Medications Ordered in ED Medications  sodium chloride 0.9 % bolus 1,000 mL (1,000 mLs Intravenous New Bag/Given 07/05/23 1329)  iohexol (OMNIPAQUE) 300 MG/ML solution 100 mL (80 mLs Intravenous Contrast Given 07/05/23 1303)  ED Course/ Medical Decision Making/ A&P                                 Medical Decision Making Amount and/or Complexity of Data Reviewed Labs: ordered. Radiology: ordered.  Risk Prescription drug management.  This patient presents to the ED for concern of left lower quadrant abdominal pain, this involves an extensive number of treatment options, and is a complaint that carries with it a high risk of complications and morbidity.  Differential diagnosis of her lower abdominal considerations include pelvic inflammatory disease, ectopic pregnancy, appendicitis, urinary calculi, primary dysmenorrhea, septic abortion, ruptured ovarian cyst or tumor, ovarian torsion, tubo-ovarian abscess, degeneration of fibroid, endometriosis, diverticulitis, cystitis.   My initial workup includes labs, imaging.   Patient declines symptom control  Additional history obtained from: Nursing notes from this visit. Previous records within EMR system office visit on 05/06/2023 with gastroenterology.  Was encouraged to take daily MiraLAX at that time. Phone conversation with primary care provider.  Was offered appointment today with NP but declined stating she would like to see a physician. Family daughter at bedside provides a portion of the history  I ordered, reviewed and interpreted labs which include: CBC, CMP, lipase, urinalysis.  No leukocytosis or anemia.  Normal lipase.  Hyperglycemia of 108, creatinine elevated to 1.24 with a baseline of 0.85 6 months ago  I ordered imaging studies including CT abdomen pelvis I independently visualized and interpreted imaging which showed 6 mm left ureteral stone with mild left hydronephrosis I agree with the radiologist interpretation  Afebrile, hypertensive but otherwise hemodynamically stable.  77 year old female presenting for evaluation of left lower quadrant abdominal pain for the past 5 days or so.  Some nausea but no vomiting.  No urinary complaints.  Lab workup is reassuring.  Urine with moderate leukocytes and rare bacteria, however this is a contaminated sample with 11-20 epithelial cells.  CT with 6 mm left ureterolithiasis.  Low suspicion for septic stone at this time.  Creatinine slightly elevated to 1.24.  She was treated with IV fluids in the emergency department.  She is tolerating oral intake without difficulty.  Pain fairly mild in the emergency department.  Will send a short course of oxycodone and educated on potential side effects and encouraged him to take this as needed.  She is sent a prescription for Zofran as well for nausea and to ensure she can continue oral intake.  She has a sulfa allergy so cannot send Flomax.  She was given contact information for urology and encouraged to follow-up as soon as possible.  At this time there does not appear to  be any evidence of an acute emergency medical condition and the patient appears stable for discharge with appropriate outpatient follow up. Diagnosis was discussed with patient who verbalizes understanding of care plan and is agreeable to discharge. I have discussed return precautions with patient and daughter who verbalizes understanding. Patient encouraged to follow-up with their PCP within 1 week. All questions answered.  Patient's case discussed with Dr. Particia Nearing who agrees with plan to discharge with follow-up.   Note: Portions of this report may have been transcribed using voice recognition software. Every effort was made to ensure accuracy; however, inadvertent computerized transcription errors may still be present.         Final Clinical Impression(s) / ED Diagnoses Final diagnoses:  Ureterolithiasis    Rx / DC Orders ED Discharge Orders  Ordered    oxyCODONE (ROXICODONE) 5 MG immediate release tablet  Every 6 hours PRN        07/05/23 1443    ondansetron (ZOFRAN-ODT) 4 MG disintegrating tablet  Every 8 hours PRN        07/05/23 1443              Michelle Piper, PA-C 07/05/23 1446    Jacalyn Lefevre, MD 07/05/23 1525

## 2023-07-07 ENCOUNTER — Other Ambulatory Visit: Payer: Self-pay | Admitting: Urology

## 2023-07-08 NOTE — Progress Notes (Signed)
Talked with patient. Instructions given. Hx and Meds reviewed. Arrival time 0800. Ok to take thyroid meds at 0600 Monday morning bring blue folder in . Husband is the driver

## 2023-07-11 ENCOUNTER — Ambulatory Visit (HOSPITAL_BASED_OUTPATIENT_CLINIC_OR_DEPARTMENT_OTHER)
Admission: RE | Admit: 2023-07-11 | Discharge: 2023-07-11 | Disposition: A | Discharge status: Home / Self Care | Payer: Medicare Other | Attending: Urology | Admitting: Urology

## 2023-07-11 ENCOUNTER — Encounter (HOSPITAL_BASED_OUTPATIENT_CLINIC_OR_DEPARTMENT_OTHER)
Admission: RE | Disposition: A | Discharge status: Home / Self Care | Payer: Self-pay | Source: Home / Self Care | Attending: Urology

## 2023-07-11 ENCOUNTER — Ambulatory Visit (HOSPITAL_COMMUNITY): Payer: Medicare Other

## 2023-07-11 ENCOUNTER — Encounter (HOSPITAL_BASED_OUTPATIENT_CLINIC_OR_DEPARTMENT_OTHER): Payer: Self-pay | Admitting: Urology

## 2023-07-11 DIAGNOSIS — N201 Calculus of ureter: Secondary | ICD-10-CM | POA: Diagnosis present

## 2023-07-11 DIAGNOSIS — K219 Gastro-esophageal reflux disease without esophagitis: Secondary | ICD-10-CM | POA: Insufficient documentation

## 2023-07-11 HISTORY — PX: EXTRACORPOREAL SHOCK WAVE LITHOTRIPSY: SHX1557

## 2023-07-11 SURGERY — LITHOTRIPSY, ESWL
Anesthesia: LOCAL | Laterality: Left

## 2023-07-11 MED ORDER — CIPROFLOXACIN HCL 500 MG PO TABS
ORAL_TABLET | ORAL | Status: AC
  Filled 2023-07-11: qty 1

## 2023-07-11 MED ORDER — DIPHENHYDRAMINE HCL 25 MG PO CAPS
ORAL_CAPSULE | ORAL | Status: AC
  Filled 2023-07-11: qty 1

## 2023-07-11 MED ORDER — SODIUM CHLORIDE 0.9 % IV SOLN: INTRAVENOUS | Status: DC

## 2023-07-11 MED ORDER — DIPHENHYDRAMINE HCL 25 MG PO CAPS
25.0000 mg | ORAL_CAPSULE | ORAL | Status: AC
  Administered 2023-07-11: 25 mg via ORAL

## 2023-07-11 MED ORDER — TRAMADOL HCL 50 MG PO TABS: 50.0000 mg | ORAL_TABLET | Freq: Four times a day (QID) | ORAL | 0 refills | Status: AC | PRN

## 2023-07-11 MED ORDER — DIAZEPAM 5 MG PO TABS
ORAL_TABLET | ORAL | Status: AC
  Filled 2023-07-11: qty 2

## 2023-07-11 MED ORDER — DIAZEPAM 5 MG PO TABS
10.0000 mg | ORAL_TABLET | ORAL | Status: AC
  Administered 2023-07-11: 10 mg via ORAL

## 2023-07-11 NOTE — Discharge Instructions (Addendum)
1. You should strain your urine and collect all fragments and bring them to your follow up appointment.  2. You should take your pain medication as needed.  Please call if your pain is severe to the point that it is not controlled with your pain medication. 3. You should call if you develop fever > 101 or persistent nausea or vomiting. 4. Your doctor may prescribe tamsulosin to take to help facilitate stone passage.  Post Anesthesia Home Care Instructions  Activity: Get plenty of rest for the remainder of the day. A responsible adult should stay with you for 24 hours following the procedure.  For the next 24 hours, DO NOT: -Drive a car -Advertising copywriter -Drink alcoholic beverages -Take any medication unless instructed by your physician -Make any legal decisions or sign important papers.  Meals: Start with liquid foods such as gelatin or soup. Progress to regular foods as tolerated. Avoid greasy, spicy, heavy foods. If nausea and/or vomiting occur, drink only clear liquids until the nausea and/or vomiting subsides. Call your physician if vomiting continues.

## 2023-07-11 NOTE — Op Note (Signed)
See [employer] operative note scanned into chart. Also because of the size, density, location and other factors that cannot be anticipated I feel this will likely be a staged procedure. This fact supersedes any indication in the scanned [place] stone operative note to the contrary.  Irine Seal MD 07/11/2023, 11:07 AM  [facility]  Pager: [phone]

## 2023-07-11 NOTE — H&P (Signed)
H&P  History of Present Illness: Donna Wells is a 77 y.o. 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    RRF   Tubular adenoma of colon     Past Surgical History:  Procedure Laterality Date   ABDOMINAL HYSTERECTOMY  2009   CARPAL TUNNEL RELEASE Bilateral    COLONOSCOPY  06/02/2017   [facility]   HEMORROIDECTOMY  2008   KNEE ARTHROSCOPY Left    POLYPECTOMY     TRIGGER FINGER RELEASE Right 03/18/2015   Procedure: RELEASE TRIGGER FINGER/A-1 PULLEY RIGHT FING FINGER;  Surgeon: Cindee Salt, MD;  Location: [facility];  Service: Orthopedics;  Laterality: Right;  REG/FAB   UPPER GASTROINTESTINAL ENDOSCOPY  06/02/2017   [facility]    Home Medications:  Current Meds  Medication Sig   allopurinol (ZYLOPRIM) 300 MG tablet Take 300 mg by mouth daily.   Calcium Carbonate-Vitamin D (CALTRATE 600+D PO) Take by mouth.   clobetasol cream (TEMOVATE) 0.05 % Apply 1 Application topically 2 (two) times daily.   Cyanocobalamin (VITAMIN B-12 PO) Place under the tongue.    levothyroxine (SYNTHROID, LEVOTHROID) 50 MCG tablet Take by mouth.   oxyCODONE (ROXICODONE) 5 MG immediate release tablet Take 1 tablet (5 mg total) by mouth every 6 (six) hours as needed for severe pain (pain score 7-10).   tamsulosin (FLOMAX) 0.4 MG CAPS capsule Take 0.4 mg by mouth daily after breakfast.   Vitamin D, Ergocalciferol, (DRISDOL) 50000 UNITS CAPS capsule Take 50,000 Units by mouth 2 (two) times a week.     Allergies:  Allergies  Allergen Reactions   Sulfa Antibiotics Rash    Family History   Problem Relation Age of Onset   Ovarian cancer Daughter    Colon polyps Father    Colon cancer Neg Hx    Esophageal cancer Neg Hx    Rectal cancer Neg Hx    Stomach cancer Neg Hx     Social History:  reports that she has never smoked. She has never used smokeless tobacco. She reports that she does not drink alcohol and does not use drugs.  ROS: A complete review of systems was performed.  All systems are negative except for pertinent findings as noted.  Physical Exam:  Vital signs in last 24 hours: Temp:  [97.9 F (36.6 C)] 97.9 F (36.6 C) (12/09 0845) Pulse Rate:  [87] 87 (12/09 0845) Resp:  [17] 17 (12/09 0845) BP: (153)/(79) 153/79 (12/09 0845) SpO2:  [99 %] 99 % (12/09 0845) Weight:  [77.8 kg] 77.8 kg (12/09 0845) Constitutional:  Alert and oriented, No acute distress Cardiovascular: Regular rate and rhythm Respiratory: Normal respiratory effort, Lungs clear bilaterally GI: Abdomen is soft, nontender, nondistended, no abdominal masses Lymphatic: No lymphadenopathy Neurologic: Grossly intact, no focal deficits Psychiatric: Normal mood and affect   Laboratory Data:  No results for input(s): "WBC", "HGB", "HCT", "PLT" in the last 72 hours.  No results for input(s): "NA", "K", "CL", "  GLUCOSE", "BUN", "CALCIUM", "CREATININE" in the last 72 hours.  Invalid input(s): "CO3"   No results found for this or any previous visit (from the past 24 hour(s)). No results found for this or any previous visit (from the past 240 hour(s)).  Renal Function: Recent Labs    07/05/23 1156  CREATININE 1.24*   Estimated Creatinine Clearance: 38.8 mL/min (A) (by C-G formula based on SCr of 1.24 mg/dL (H)).  Radiologic Imaging: No results found.  Assessment:  Donna Wells is a 77 y.o. year old F with left proximal stone   Plan:  --to OR as planned for L ESWL. Procedure and risks reviewed, including but not limited to hematuria, infection, sepsis, damage to GU tract, failure to  complete procedure, retained stone fragments, need for future procedures  Irine Seal, MD 07/11/2023, 9:55 AM  [facility] Pager: [phone]

## 2023-07-12 ENCOUNTER — Encounter (HOSPITAL_BASED_OUTPATIENT_CLINIC_OR_DEPARTMENT_OTHER): Payer: Self-pay | Admitting: Urology

## 2023-07-28 ENCOUNTER — Telehealth: Payer: Self-pay | Admitting: Physician Assistant

## 2023-07-28 NOTE — Telephone Encounter (Signed)
Called patient and she states she no longer has the kidney stones, she did have the lithotripsy. Informed the patient as long as she was in no pain and able to tolerate the prep, it would not be a problem. Patient states she just wanted to make sure.

## 2023-07-28 NOTE — Telephone Encounter (Signed)
Patient called in regards to having kidney stones, would like to know if it'll affect her upcoming procedure.

## 2023-08-02 ENCOUNTER — Ambulatory Visit: Payer: Medicare Other | Admitting: Internal Medicine

## 2023-08-02 ENCOUNTER — Encounter: Payer: Self-pay | Admitting: Internal Medicine

## 2023-08-02 VITALS — BP 166/84 | HR 84 | Temp 97.7°F | Resp 15 | Ht 64.0 in | Wt 176.0 lb

## 2023-08-02 DIAGNOSIS — K5730 Diverticulosis of large intestine without perforation or abscess without bleeding: Secondary | ICD-10-CM | POA: Diagnosis not present

## 2023-08-02 DIAGNOSIS — K449 Diaphragmatic hernia without obstruction or gangrene: Secondary | ICD-10-CM

## 2023-08-02 DIAGNOSIS — Z1211 Encounter for screening for malignant neoplasm of colon: Secondary | ICD-10-CM

## 2023-08-02 DIAGNOSIS — K2289 Other specified disease of esophagus: Secondary | ICD-10-CM

## 2023-08-02 DIAGNOSIS — D122 Benign neoplasm of ascending colon: Secondary | ICD-10-CM | POA: Diagnosis not present

## 2023-08-02 DIAGNOSIS — K222 Esophageal obstruction: Secondary | ICD-10-CM

## 2023-08-02 DIAGNOSIS — D120 Benign neoplasm of cecum: Secondary | ICD-10-CM

## 2023-08-02 DIAGNOSIS — K2282 Esophagogastric junction polyp: Secondary | ICD-10-CM

## 2023-08-02 DIAGNOSIS — R1319 Other dysphagia: Secondary | ICD-10-CM

## 2023-08-02 DIAGNOSIS — Z860101 Personal history of adenomatous and serrated colon polyps: Secondary | ICD-10-CM

## 2023-08-02 MED ORDER — SODIUM CHLORIDE 0.9 % IV SOLN: 500.0000 mL | INTRAVENOUS | Status: DC

## 2023-08-02 NOTE — Progress Notes (Signed)
 GASTROENTEROLOGY PROCEDURE H&P NOTE   Primary Care Physician: Ezzard Elsie NOVAK, MD    Reason for Procedure:  Dysphagia, history of Schatzki's ring, history of multiple adenomatous colon polyps  Plan:    EGD with possible dilation and colonoscopy  Patient is appropriate for endoscopic procedure(s) in the ambulatory ([facility]) setting.  The nature of the procedure, as well as the risks, benefits, and alternatives were carefully and thoroughly reviewed with the patient. Ample time for discussion and questions allowed. The patient understood, was satisfied, and agreed to proceed.     HPI: Donna Wells is a 77 y.o. female who presents for EGD and colonoscopy.  Medical history as below.  Tolerated the prep.  No recent chest pain or shortness of breath.  No abdominal pain today.  Past Medical History:  Diagnosis Date   Arthritis    Chronic gastritis    Diverticulosis    Elevated cholesterol    GERD (gastroesophageal reflux disease)    Gout    Hepatitis    Hep C at 77 yrs old   Hiatal hernia    Infectious hepatitis    age 35   Internal hemorrhoids    Kidney stones    Schatzki's ring    Stenosing tenosynovitis of finger of right hand    RRF   Tubular adenoma of colon     Past Surgical History:  Procedure Laterality Date   ABDOMINAL HYSTERECTOMY  2009   CARPAL TUNNEL RELEASE Bilateral    COLONOSCOPY  06/02/2017   Jamelle Noy   EXTRACORPOREAL SHOCK WAVE LITHOTRIPSY Left 07/11/2023   Procedure: LEFT EXTRACORPOREAL SHOCK WAVE LITHOTRIPSY (ESWL);  Surgeon: Lovie Arlyss CROME, MD;  Location: [facility];  Service: Urology;  Laterality: Left;   HEMORROIDECTOMY  2008   KNEE ARTHROSCOPY Left    POLYPECTOMY     TRIGGER FINGER RELEASE Right 03/18/2015   Procedure: RELEASE TRIGGER FINGER/A-1 PULLEY RIGHT FING FINGER;  Surgeon: Arley Curia, MD;  Location: [facility];  Service: Orthopedics;  Laterality: Right;  REG/FAB   UPPER GASTROINTESTINAL ENDOSCOPY   06/02/2017   Jacelyn Cuen    Prior to Admission medications   Medication Sig Start Date End Date Taking? Authorizing Provider  allopurinol (ZYLOPRIM) 300 MG tablet Take 300 mg by mouth daily. 02/28/23  Yes [provider]  Calcium Carbonate-Vitamin D (CALTRATE 600+D PO) Take by mouth.   Yes [provider]  Cyanocobalamin (VITAMIN B-12 PO) Place under the tongue.    Yes [provider]  levothyroxine (SYNTHROID, LEVOTHROID) 50 MCG tablet Take by mouth. 05/31/17  Yes [provider]  Vitamin D, Ergocalciferol, (DRISDOL) 50000 UNITS CAPS capsule Take 50,000 Units by mouth 2 (two) times a week.    Yes [provider]  clobetasol cream (TEMOVATE) 0.05 % Apply 1 Application topically 2 (two) times daily.    [provider]  colchicine 0.6 MG tablet Take 0.6 mg by mouth daily. 12/29/22   [provider]  ondansetron  (ZOFRAN -ODT) 4 MG disintegrating tablet Take 1 tablet (4 mg total) by mouth every 8 (eight) hours as needed for nausea or vomiting. Patient not taking: Reported on 08/02/2023 07/05/23   Edwardo Marsa HERO, PA-C  oxyCODONE  (ROXICODONE ) 5 MG immediate release tablet Take 1 tablet (5 mg total) by mouth every 6 (six) hours as needed for severe pain (pain score 7-10). 07/05/23   Schutt, Marsa HERO, PA-C  tamsulosin (FLOMAX) 0.4 MG CAPS capsule Take 0.4 mg by mouth daily after breakfast.    [provider]    Current Outpatient Medications  Medication Sig Dispense Refill   allopurinol (ZYLOPRIM) 300 MG tablet Take 300 mg by mouth daily.     Calcium Carbonate-Vitamin D (CALTRATE 600+D PO) Take by mouth.     Cyanocobalamin (VITAMIN B-12 PO) Place under the tongue.      levothyroxine (SYNTHROID, LEVOTHROID) 50 MCG tablet Take by mouth.     Vitamin D, Ergocalciferol, (DRISDOL) 50000 UNITS CAPS capsule Take 50,000 Units by mouth 2 (two) times a week.      clobetasol cream (TEMOVATE) 0.05 % Apply 1 Application topically 2 (two) times  daily.     colchicine 0.6 MG tablet Take 0.6 mg by mouth daily.     ondansetron  (ZOFRAN -ODT) 4 MG disintegrating tablet Take 1 tablet (4 mg total) by mouth every 8 (eight) hours as needed for nausea or vomiting. (Patient not taking: Reported on 08/02/2023) 20 tablet 0   oxyCODONE  (ROXICODONE ) 5 MG immediate release tablet Take 1 tablet (5 mg total) by mouth every 6 (six) hours as needed for severe pain (pain score 7-10). 5 tablet 0   tamsulosin (FLOMAX) 0.4 MG CAPS capsule Take 0.4 mg by mouth daily after breakfast.     Current Facility-Administered Medications  Medication Dose Route Frequency Provider Last Rate Last Admin   0.9 %  sodium chloride  infusion  500 mL Intravenous Continuous Layton Naves, Gordy HERO, MD        Allergies as of 08/02/2023 - Review Complete 08/02/2023  Allergen Reaction Noted   Sulfa antibiotics Rash 03/17/2015    Family History  Problem Relation Age of Onset   Ovarian cancer Daughter    Colon polyps Father    Colon cancer Neg Hx    Esophageal cancer Neg Hx    Rectal cancer Neg Hx    Stomach cancer Neg Hx     Social History   Socioeconomic History   Marital status: Married    Spouse name: Not on file   Number of children: 2   Years of education: Not on file   Highest education level: Not on file  Occupational History   Occupation: retired  Tobacco Use   Smoking status: Never   Smokeless tobacco: Never  Vaping Use   Vaping status: Never Used  Substance and Sexual Activity   Alcohol use: No   Drug use: No   Sexual activity: Yes    Birth control/protection: Surgical  Other Topics Concern   Not on file  Social History Narrative   Not on file   Social Drivers of Health   Financial Resource Strain: Not on file  Food Insecurity: Not on file  Transportation Needs: Not on file  Physical Activity: Not on file  Stress: Not on file  Social Connections: Not on file  Intimate Partner Violence: Not on file    Physical Exam: Vital signs in last 24  hours: @BP  (!) 153/73   Pulse 88   Temp 97.7 F (36.5 C)   Ht 5' 4 (1.626 m)   Wt 176 lb (79.8 kg)   SpO2 98%   BMI 30.21 kg/m  GEN: NAD EYE: Sclerae anicteric ENT: MMM CV: Non-tachycardic Pulm: CTA b/l GI: Soft, NT/ND NEURO:  Alert & Oriented x 3   Gordy Starch, MD [facility]  08/02/2023 9:10 AM

## 2023-08-02 NOTE — Progress Notes (Signed)
 Called to room to assist during endoscopic procedure.  Patient ID and intended procedure confirmed with present staff. Received instructions for my participation in the procedure from the performing physician.

## 2023-08-02 NOTE — Progress Notes (Signed)
Vss nad trans to pacu 

## 2023-08-02 NOTE — Op Note (Signed)
 [facility] Patient Name: Donna Wells Procedure Date: 08/02/2023 9:12 AM MRN: [id number] Endoscopist: Gordy CHRISTELLA Starch , MD, [id number] Age: 77 Referring MD:  Date of Birth: 1945-11-17 Gender: Female Account #: [id number] Procedure:                Upper GI endoscopy Indications:              Dysphagia, EGD Nov 2018 (esophageal ring, dilation                            with scope, bx inflammatory) Medicines:                Monitored Anesthesia Care Procedure:                Pre-Anesthesia Assessment:                           - Prior to the procedure, a History and Physical                            was performed, and patient medications and                            allergies were reviewed. The patient's tolerance of                            previous anesthesia was also reviewed. The risks                            and benefits of the procedure and the sedation                            options and risks were discussed with the patient.                            All questions were answered, and informed consent                            was obtained. Prior Anticoagulants: The patient has                            taken no anticoagulant or antiplatelet agents. ASA                            Grade Assessment: II - A patient with mild systemic                            disease. After reviewing the risks and benefits,                            the patient was deemed in satisfactory condition to                            undergo the procedure.  After obtaining informed consent, the endoscope was                            passed under direct vision. Throughout the                            procedure, the patient's blood pressure, pulse, and                            oxygen saturations were monitored continuously. The                            GIF HQ190 #[id number] was introduced through the                            mouth, and advanced to the  second part of duodenum.                            The upper GI endoscopy was accomplished without                            difficulty. The patient tolerated the procedure                            well. Scope In: Scope Out: Findings:                 A single 6 mm polyp/nodule was found at the GE                            junction, 37 cm from the incisors. Biopsies were                            taken with a cold forceps for histology.                           A low-grade of narrowing Schatzki ring was found at                            the gastroesophageal junction.                           A 2 cm hiatal hernia was present.                           The entire examined stomach was normal.                           The examined duodenum was normal. Complications:            No immediate complications. Estimated Blood Loss:     Estimated blood loss was minimal. Impression:               - Esophageal polyp/nodule was found at GE junction.  Biopsied to exclude dysplasia.                           - Low-grade of narrowing Schatzki ring.                           - 2 cm hiatal hernia.                           - Normal stomach.                           - Normal examined duodenum. Recommendation:           - Patient has a contact number available for                            emergencies. The signs and symptoms of potential                            delayed complications were discussed with the                            patient. Return to normal activities tomorrow.                            Written discharge instructions were provided to the                            patient.                           - Resume previous diet.                           - Continue present medications.                           - Await pathology results.                           - Possible repeat EGD in 8 weeks, depending on                            pathology results  for dilation at that time. Gordy CHRISTELLA Starch, MD 08/02/2023 9:52:57 AM This report has been signed electronically.

## 2023-08-02 NOTE — Patient Instructions (Signed)
 - Resume previous diet - Continue present medications. - Await pathology results - Patient given educational handouts related to procedure. - Possible repeat EGD in 8 weeks, depending on pathology results for dilation at that time.   YOU HAD AN ENDOSCOPIC PROCEDURE TODAY AT THE [facility]:   Refer to the procedure report that was given to you for any specific questions about what was found during the examination.  If the procedure report does not answer your questions, please call your gastroenterologist to clarify.  If you requested that your care partner not be given the details of your procedure findings, then the procedure report has been included in a sealed envelope for you to review at your convenience later.  YOU SHOULD EXPECT: Some feelings of bloating in the abdomen. Passage of more gas than usual.  Walking can help get rid of the air that was put into your GI tract during the procedure and reduce the bloating. If you had a lower endoscopy (such as a colonoscopy or flexible sigmoidoscopy) you may notice spotting of blood in your stool or on the toilet paper. If you underwent a bowel prep for your procedure, you may not have a normal bowel movement for a few days.  Please Note:  You might notice some irritation and congestion in your nose or some drainage.  This is from the oxygen used during your procedure.  There is no need for concern and it should clear up in a day or so.  SYMPTOMS TO REPORT IMMEDIATELY:  Following lower endoscopy (colonoscopy or flexible sigmoidoscopy):  Excessive amounts of blood in the stool  Significant tenderness or worsening of abdominal pains  Swelling of the abdomen that is new, acute  Fever of 100F or higher  Following upper endoscopy (EGD)  Vomiting of blood or coffee ground material  New chest pain or pain under the shoulder blades  Painful or persistently difficult swallowing  New shortness of breath  Fever of 100F or higher  Black,  tarry-looking stools  For urgent or emergent issues, a gastroenterologist can be reached at any hour by calling [phone]. Do not use MyChart messaging for urgent concerns.    DIET:  We do recommend a small meal at first, but then you may proceed to your regular diet.  Drink plenty of fluids but you should avoid alcoholic beverages for 24 hours.  ACTIVITY:  You should plan to take it easy for the rest of today and you should NOT DRIVE or use heavy machinery until tomorrow (because of the sedation medicines used during the test).    FOLLOW UP: Our staff will call the number listed on your records the next business day following your procedure.  We will call around 7:15- 8:00 am to check on you and address any questions or concerns that you may have regarding the information given to you following your procedure. If we do not reach you, we will leave a message.     If any biopsies were taken you will be contacted by phone or by letter within the next 1-3 weeks.  Please call us  at [phone] if you have not heard about the biopsies in 3 weeks.    SIGNATURES/CONFIDENTIALITY: You and/or your care partner have signed paperwork which will be entered into your electronic medical record.  These signatures attest to the fact that that the information above on your After Visit Summary has been reviewed and is understood.  Full responsibility of the confidentiality of this discharge  information lies with you and/or your care-partner.

## 2023-08-02 NOTE — Op Note (Signed)
 [facility] Patient Name: Donna Wells Procedure Date: 08/02/2023 9:08 AM MRN: [id number] Endoscopist: Gordy CHRISTELLA Starch , MD, [id number] Age: 77 Referring MD:  Date of Birth: Jan 18, 1946 Gender: Female Account #: [id number] Procedure:                Colonoscopy Indications:              High risk colon cancer surveillance: Personal                            history of multiple (3 or more) adenomas, Last                            colonoscopy: November 2021 (TA x 3, SSP x 1); Nov                            2018 (TA x 5) Medicines:                Monitored Anesthesia Care Procedure:                Pre-Anesthesia Assessment:                           - Prior to the procedure, a History and Physical                            was performed, and patient medications and                            allergies were reviewed. The patient's tolerance of                            previous anesthesia was also reviewed. The risks                            and benefits of the procedure and the sedation                            options and risks were discussed with the patient.                            All questions were answered, and informed consent                            was obtained. Prior Anticoagulants: The patient has                            taken no anticoagulant or antiplatelet agents. ASA                            Grade Assessment: II - A patient with mild systemic                            disease. After reviewing the risks and benefits,  the patient was deemed in satisfactory condition to                            undergo the procedure.                           After obtaining informed consent, the colonoscope                            was passed under direct vision. Throughout the                            procedure, the patient's blood pressure, pulse, and                            oxygen saturations were monitored continuously. The                             [id number]-HQ190L Colonoscope [id number] was introduced                            through the anus and advanced to the cecum,                            identified by appendiceal orifice and ileocecal                            valve. The colonoscopy was performed without                            difficulty. The patient tolerated the procedure                            well. The quality of the bowel preparation was                            good. The ileocecal valve, appendiceal orifice, and                            rectum were photographed. Scope In: 9:31:49 AM Scope Out: 9:47:38 AM Scope Withdrawal Time: 0 hours 13 minutes 14 seconds  Total Procedure Duration: 0 hours 15 minutes 49 seconds  Findings:                 The digital rectal exam was normal.                           Two sessile polyps were found in the cecum. The                            polyps were 2 to 5 mm in size. These polyps were                            removed with a cold snare. Resection and retrieval  were complete.                           A 4 mm polyp was found in the ascending colon. The                            polyp was sessile. The polyp was removed with a                            cold snare. Resection and retrieval were complete.                           Multiple medium-mouthed and small-mouthed                            diverticula were found in the sigmoid colon,                            descending colon and hepatic flexure.                           The retroflexed view of the distal rectum and anal                            verge was normal and showed no anal or rectal                            abnormalities. Complications:            No immediate complications. Estimated Blood Loss:     Estimated blood loss was minimal. Impression:               - Two 2 to 5 mm polyps in the cecum, removed with a                            cold snare.  Resected and retrieved.                           - One 4 mm polyp in the ascending colon, removed                            with a cold snare. Resected and retrieved.                           - Moderate diverticulosis in the sigmoid colon, in                            the descending colon and at the hepatic flexure.                           - The distal rectum and anal verge are normal on                            retroflexion view. Recommendation:           -  Patient has a contact number available for                            emergencies. The signs and symptoms of potential                            delayed complications were discussed with the                            patient. Return to normal activities tomorrow.                            Written discharge instructions were provided to the                            patient.                           - Continue present medications.                           - Resume previous diet.                           - Await pathology results.                           - No recommendation at this time regarding repeat                            colonoscopy due to age at next surveillance                            interval (> 80 years). Gordy CHRISTELLA Starch, MD 08/02/2023 9:55:40 AM This report has been signed electronically.

## 2023-08-05 ENCOUNTER — Telehealth: Payer: Self-pay

## 2023-08-05 NOTE — Telephone Encounter (Signed)
 Attempted f/u call. No answer, VM not setup.

## 2023-08-08 LAB — SURGICAL PATHOLOGY

## 2023-08-09 ENCOUNTER — Other Ambulatory Visit: Payer: Self-pay

## 2023-08-09 DIAGNOSIS — R1319 Other dysphagia: Secondary | ICD-10-CM

## 2023-08-09 DIAGNOSIS — K219 Gastro-esophageal reflux disease without esophagitis: Secondary | ICD-10-CM

## 2023-09-27 ENCOUNTER — Telehealth: Payer: Self-pay | Admitting: Internal Medicine

## 2023-09-27 MED ORDER — PANTOPRAZOLE SODIUM 40 MG PO TBEC: 40.0000 mg | DELAYED_RELEASE_TABLET | Freq: Every day | ORAL | 1 refills | Status: AC

## 2023-09-27 NOTE — Telephone Encounter (Signed)
 PT is calling to find out if she needs to be here at 8am for the EGD on 10/04/2023. Please advise.

## 2023-09-27 NOTE — Telephone Encounter (Signed)
 Informed patient to arrive at 9:00 am for her EGD on 10/04/23. Patient verbalized understanding. Patient also states she was under the impression Dr. Rhea Belton wanted her to start a new medication but she cannot recall the name of it. She believes it was never sent to her pharmacy. Informed patient the medication is pantoprazole 40 mg daily. Informed patient I sent it to her pharmacy and she should be able to pick it up today. Patient verbalized understanding.

## 2023-10-04 ENCOUNTER — Ambulatory Visit (AMBULATORY_SURGERY_CENTER): Payer: Medicare Other | Admitting: Internal Medicine

## 2023-10-04 ENCOUNTER — Encounter: Payer: Self-pay | Admitting: Internal Medicine

## 2023-10-04 VITALS — BP 151/81 | HR 74 | Temp 97.5°F | Resp 10 | Ht 65.0 in | Wt 173.2 lb

## 2023-10-04 DIAGNOSIS — R1310 Dysphagia, unspecified: Secondary | ICD-10-CM

## 2023-10-04 DIAGNOSIS — K219 Gastro-esophageal reflux disease without esophagitis: Secondary | ICD-10-CM

## 2023-10-04 DIAGNOSIS — K222 Esophageal obstruction: Secondary | ICD-10-CM

## 2023-10-04 DIAGNOSIS — K449 Diaphragmatic hernia without obstruction or gangrene: Secondary | ICD-10-CM | POA: Diagnosis not present

## 2023-10-04 DIAGNOSIS — R1319 Other dysphagia: Secondary | ICD-10-CM

## 2023-10-04 MED ORDER — SODIUM CHLORIDE 0.9 % IV SOLN: 500.0000 mL | INTRAVENOUS | Status: DC

## 2023-10-04 NOTE — Op Note (Signed)
 [facility] Patient Name: Donna Wells Procedure Date: 10/04/2023 9:57 AM MRN: [id number] Endoscopist: Beverley Fiedler , MD, [id number] Age: 78 Referring MD:  Date of Birth: 12/11/1945 Gender: Female Account #: [id number] Procedure:                Upper GI endoscopy Indications:              Dysphagia with known stricture, Follow-up of reflux                            esophagitis (nodule at GE junction inflammatory in                            nature), pantoprazole 40 mg daily since last EGD on                            08/02/2023 Medicines:                Monitored Anesthesia Care Procedure:                Pre-Anesthesia Assessment:                           - Prior to the procedure, a History and Physical                            was performed, and patient medications and                            allergies were reviewed. The patient's tolerance of                            previous anesthesia was also reviewed. The risks                            and benefits of the procedure and the sedation                            options and risks were discussed with the patient.                            All questions were answered, and informed consent                            was obtained. Prior Anticoagulants: The patient has                            taken no anticoagulant or antiplatelet agents. ASA                            Grade Assessment: II - A patient with mild systemic                            disease. After reviewing the risks and benefits,  the patient was deemed in satisfactory condition to                            undergo the procedure.                           After obtaining informed consent, the endoscope was                            passed under direct vision. Throughout the                            procedure, the patient's blood pressure, pulse, and                            oxygen saturations were monitored  continuously. The                            Olympus Scope SN [id number] was introduced through the                            mouth, and advanced to the second part of duodenum.                            The upper GI endoscopy was accomplished without                            difficulty. The patient tolerated the procedure                            well. Scope In: Scope Out: Findings:                 Normal mucosa was found in the entire esophagus.                           One benign-appearing, intrinsic moderate                            (circumferential scarring or stenosis; an endoscope                            may pass) stenosis was found 37 cm from the                            incisors. This stenosis measured 1.3 cm (inner                            diameter) x less than one cm (in length). The                            stenosis was traversed. A TTS dilator was passed                            through the  scope. Dilation with a 15-16.5-18 mm                            balloon dilator was performed to 18 mm.                           A 2 cm hiatal hernia was present.                           The entire examined stomach was normal.                           The examined duodenum was normal. Complications:            No immediate complications. Estimated Blood Loss:     Estimated blood loss: none. Impression:               - Normal mucosa was found in the entire esophagus.                           - Benign-appearing esophageal stenosis. Dilated to                            18 mm with balloon.                           - 2 cm hiatal hernia.                           - Normal stomach.                           - Normal examined duodenum.                           - No specimens collected. Recommendation:           - Patient has a contact number available for                            emergencies. The signs and symptoms of potential                            delayed  complications were discussed with the                            patient. Return to normal activities tomorrow.                            Written discharge instructions were provided to the                            patient.                           - Resume previous diet.                           -  Continue present medications. Given improvement                            can stop pantoprazole. If recurrent heartburn or                            trouble swallowing please contact my office and                            consider resuming pantoprazole. Beverley Fiedler, MD 10/04/2023 10:17:24 AM This report has been signed electronically.

## 2023-10-04 NOTE — Progress Notes (Signed)
 GASTROENTEROLOGY PROCEDURE H&P NOTE   Primary Care Physician: Theodoro Kos, MD    Reason for Procedure:  GERD and dysphagia, Schatzki's ring  Plan:    EGD and dilation  Patient is appropriate for endoscopic procedure(s) in the ambulatory ([facility]) setting.  The nature of the procedure, as well as the risks, benefits, and alternatives were carefully and thoroughly reviewed with the patient. Ample time for discussion and questions allowed. The patient understood, was satisfied, and agreed to proceed.     HPI: Donna Wells is a 78 y.o. female who presents for EGD and dilation.  Medical history as below.   No recent chest pain or shortness of breath.  No abdominal pain today.  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    RRF   Tubular adenoma of colon     Past Surgical History:  Procedure Laterality Date   ABDOMINAL HYSTERECTOMY  2009   CARPAL TUNNEL RELEASE Bilateral    COLONOSCOPY  06/02/2017   Rolla Kedzierski   EXTRACORPOREAL SHOCK WAVE LITHOTRIPSY Left 07/11/2023   Procedure: LEFT EXTRACORPOREAL SHOCK WAVE LITHOTRIPSY (ESWL);  Surgeon: Despina Arias, MD;  Location: [facility];  Service: Urology;  Laterality: Left;   HEMORROIDECTOMY  2008   KNEE ARTHROSCOPY Left    POLYPECTOMY     TRIGGER FINGER RELEASE Right 03/18/2015   Procedure: RELEASE TRIGGER FINGER/A-1 PULLEY RIGHT FING FINGER;  Surgeon: Cindee Salt, MD;  Location: [facility];  Service: Orthopedics;  Laterality: Right;  REG/FAB   UPPER GASTROINTESTINAL ENDOSCOPY  06/02/2017   Odel Schmid    Prior to Admission medications   Medication Sig Start Date End Date Taking?  Authorizing Provider  allopurinol (ZYLOPRIM) 300 MG tablet Take 300 mg by mouth daily. 02/28/23  Yes [provider]  Calcium Carbonate-Vitamin D (CALTRATE 600+D PO) Take by mouth.   Yes [provider]  Cyanocobalamin (VITAMIN B-12 PO) Place under the tongue.    Yes [provider]  levothyroxine (SYNTHROID, LEVOTHROID) 50 MCG tablet Take by mouth. 05/31/17  Yes [provider]  pantoprazole (PROTONIX) 40 MG tablet Take 1 tablet (40 mg total) by mouth daily. 09/27/23  Yes Roylee Chaffin, Carie Caddy, MD  Vitamin D, Ergocalciferol, (DRISDOL) 50000 UNITS CAPS capsule Take 50,000 Units by mouth 2 (two) times a week.    Yes [provider]  clobetasol cream (TEMOVATE) 0.05 % Apply 1 Application topically 2 (two) times daily.    [provider]  colchicine 0.6 MG tablet Take 0.6 mg by mouth daily. 12/29/22   [provider]  ondansetron (ZOFRAN-ODT) 4 MG disintegrating tablet Take 1 tablet (4 mg total) by mouth every 8 (eight) hours as needed for nausea or vomiting. Patient not taking: Reported on 08/02/2023 07/05/23   Michelle Piper, PA-C  oxyCODONE (ROXICODONE) 5 MG immediate release tablet Take 1 tablet (5 mg total) by mouth every 6 (six) hours as needed for severe pain (pain score 7-10). 07/05/23   Schutt, Edsel Petrin, PA-C  tamsulosin (FLOMAX) 0.4 MG CAPS capsule Take 0.4  mg by mouth daily after breakfast.    [provider]    Current Outpatient Medications  Medication Sig Dispense Refill   allopurinol (ZYLOPRIM) 300 MG tablet Take 300 mg by mouth daily.     Calcium Carbonate-Vitamin D (CALTRATE 600+D PO) Take by mouth.     Cyanocobalamin (VITAMIN B-12 PO) Place under the tongue.      levothyroxine (SYNTHROID, LEVOTHROID) 50 MCG tablet Take by mouth.     pantoprazole (PROTONIX) 40 MG tablet Take 1 tablet (40 mg total) by mouth daily. 30 tablet 1   Vitamin D, Ergocalciferol, (DRISDOL) 50000 UNITS CAPS capsule Take 50,000 Units by mouth  2 (two) times a week.      clobetasol cream (TEMOVATE) 0.05 % Apply 1 Application topically 2 (two) times daily.     colchicine 0.6 MG tablet Take 0.6 mg by mouth daily.     ondansetron (ZOFRAN-ODT) 4 MG disintegrating tablet Take 1 tablet (4 mg total) by mouth every 8 (eight) hours as needed for nausea or vomiting. (Patient not taking: Reported on 08/02/2023) 20 tablet 0   oxyCODONE (ROXICODONE) 5 MG immediate release tablet Take 1 tablet (5 mg total) by mouth every 6 (six) hours as needed for severe pain (pain score 7-10). 5 tablet 0   tamsulosin (FLOMAX) 0.4 MG CAPS capsule Take 0.4 mg by mouth daily after breakfast.     Current Facility-Administered Medications  Medication Dose Route Frequency Provider Last Rate Last Admin   0.9 %  sodium chloride infusion  500 mL Intravenous Continuous Shawnell Dykes, Carie Caddy, MD        Allergies as of 10/04/2023 - Review Complete 10/04/2023  Allergen Reaction Noted   Sulfa antibiotics Rash 03/17/2015    Family History  Problem Relation Age of Onset   Ovarian cancer Daughter    Colon polyps Father    Colon cancer Neg Hx    Esophageal cancer Neg Hx    Rectal cancer Neg Hx    Stomach cancer Neg Hx     Social History   Socioeconomic History   Marital status: Married    Spouse name: Not on file   Number of children: 2   Years of education: Not on file   Highest education level: Not on file  Occupational History   Occupation: retired  Tobacco Use   Smoking status: Never   Smokeless tobacco: Never  Vaping Use   Vaping status: Never Used  Substance and Sexual Activity   Alcohol use: No   Drug use: No   Sexual activity: Yes    Birth control/protection: Surgical  Other Topics Concern   Not on file  Social History Narrative   Not on file   Social Drivers of Health   Financial Resource Strain: Not on file  Food Insecurity: Not on file  Transportation Needs: Not on file  Physical Activity: Not on file  Stress: Not on file  Social  Connections: Not on file  Intimate Partner Violence: Not on file    Physical Exam: Vital signs in last 24 hours: @BP  (!) 153/80   Pulse 73   Temp (!) 97.5 F (36.4 C)   Ht 5\' 5"  (1.651 m)   Wt 173 lb 3.2 oz (78.6 kg)   SpO2 95%   BMI 28.82 kg/m  GEN: NAD EYE: Sclerae anicteric ENT: MMM CV: Non-tachycardic Pulm: CTA b/l GI: Soft, NT/ND NEURO:  Alert & Oriented x 3   Erick Blinks, MD [facility]  10/04/2023 9:54 AM

## 2023-10-04 NOTE — Progress Notes (Signed)
 Report to PACU, RN, vss, BBS= Clear.

## 2023-10-04 NOTE — Progress Notes (Signed)
 Pt's states no medical or surgical changes since previsit or office visit.

## 2023-10-04 NOTE — Patient Instructions (Signed)
 Stop Pantoprazole  YOU HAD AN ENDOSCOPIC PROCEDURE TODAY: Refer to the procedure report and other information in the discharge instructions given to you for any specific questions about what was found during the examination. If this information does not answer your questions, please call [place] office at [phone] to clarify.   YOU SHOULD EXPECT: Some feelings of bloating in the abdomen. Passage of more gas than usual. Walking can help get rid of the air that was put into your GI tract during the procedure and reduce the bloating. If you had a lower endoscopy (such as a colonoscopy or flexible sigmoidoscopy) you may notice spotting of blood in your stool or on the toilet paper. Some abdominal soreness may be present for a day or two, also.  DIET: Your first meal following the procedure should be a light meal and then it is ok to progress to your normal diet. A half-sandwich or bowl of soup is an example of a good first meal. Heavy or fried foods are harder to digest and may make you feel nauseous or bloated. Drink plenty of fluids but you should avoid alcoholic beverages for 24 hours. If you had a esophageal dilation, please see attached instructions for diet.    ACTIVITY: Your care partner should take you home directly after the procedure. You should plan to take it easy, moving slowly for the rest of the day. You can resume normal activity the day after the procedure however YOU SHOULD NOT DRIVE, use power tools, machinery or perform tasks that involve climbing or major physical exertion for 24 hours (because of the sedation medicines used during the test).   SYMPTOMS TO REPORT IMMEDIATELY: A gastroenterologist can be reached at any hour. Please call [phone]  for any of the following symptoms:  Following upper endoscopy (EGD, EUS, ERCP, esophageal dilation) Vomiting of blood or coffee ground material  New, significant abdominal pain  New, significant chest pain or pain under the shoulder  blades  Painful or persistently difficult swallowing  New shortness of breath  Black, tarry-looking or red, bloody stools  FOLLOW UP:  If any biopsies were taken you will be contacted by phone or by letter within the next 1-3 weeks. Call [phone]  if you have not heard about the biopsies in 3 weeks.  Please also call with any specific questions about appointments or follow up tests.

## 2023-10-05 ENCOUNTER — Telehealth: Payer: Self-pay | Admitting: *Deleted

## 2023-10-05 NOTE — Telephone Encounter (Signed)
 Left message on f/u call
